# Patient Record
Sex: Female | Born: 1990 | Race: Black or African American | Hispanic: No | Marital: Single | Smoking: Former smoker
Health system: Southern US, Community
[De-identification: ages and names within clinical notes are randomized; demographics above are authoritative.]

---

## 2014-11-10 ENCOUNTER — Encounter (HOSPITAL_BASED_OUTPATIENT_CLINIC_OR_DEPARTMENT_OTHER): Payer: Self-pay

## 2014-11-10 ENCOUNTER — Emergency Department (HOSPITAL_BASED_OUTPATIENT_CLINIC_OR_DEPARTMENT_OTHER)
Admission: EM | Admit: 2014-11-10 | Discharge: 2014-11-10 | Disposition: A | Discharge status: Home / Self Care | Payer: Medicaid Other | Attending: Emergency Medicine | Admitting: Emergency Medicine

## 2014-11-10 DIAGNOSIS — L509 Urticaria, unspecified: Secondary | ICD-10-CM | POA: Insufficient documentation

## 2014-11-10 DIAGNOSIS — R21 Rash and other nonspecific skin eruption: Secondary | ICD-10-CM | POA: Diagnosis present

## 2014-11-10 MED ORDER — DEXAMETHASONE SODIUM PHOSPHATE 10 MG/ML IJ SOLN
10.0000 mg | Freq: Once | INTRAMUSCULAR | Status: AC
  Administered 2014-11-10: 10 mg via INTRAMUSCULAR
  Filled 2014-11-10: qty 1

## 2014-11-10 MED ORDER — HYDROXYZINE HCL 25 MG PO TABS: 25.0000 mg | ORAL_TABLET | Freq: Four times a day (QID) | ORAL | Status: DC | PRN

## 2014-11-10 NOTE — ED Notes (Signed)
Pt c/o hives since last night, she took benadryl and they went away, came back this evening.  Benadryl dose was 8 hours prior to arrival.  Pt denies SOB, no acute distress noted in triage.

## 2014-11-10 NOTE — ED Provider Notes (Signed)
CSN: [id number]     Arrival date & time 11/10/14  2310 History  By signing my name below, I, Lyndel Safe, attest that this documentation has been prepared under the direction and in the presence of Paula Libra, MD. Electronically Signed: Lyndel Safe, ED Scribe. 11/10/2014. 11:29 PM.   Chief Complaint  Patient presents with  . Allergic Reaction   HPI HPI Comments: Diane Austin is a 24 y.o. female who presents to the Emergency Department complaining of sudden onset urticarial rash that is diffuse and appeared without known cause yesterday evening. There was some transient associated throat swelling and difficulty breathing that resolved. She took one dose of Benadryl about 5 PM with transient partial relief. Denies diarrhea, wheezing, or current throat swelling or trouble breathing. Associated itching is moderate to severe and the urticarial lesions have been coming and going on various parts of her body. Lesions were more prominent on the trunk earlier but are now most prominent in the groins and lower legs.  History reviewed. No pertinent past medical history. Past Surgical History  Procedure Laterality Date  . Cesarean section  2015   No family history on file. Social History  Substance Use Topics  . Smoking status: None  . Smokeless tobacco: None  . Alcohol Use: None   OB History    No data available     Review of Systems A complete 10 system review of systems was obtained and is otherwise negative except at noted in the HPI and PMH.  Allergies  Review of patient's allergies indicates no known allergies.  Home Medications   Prior to Admission medications   Medication Sig Start Date End Date Taking? Authorizing Provider  hydrOXYzine (ATARAX/VISTARIL) 25 MG tablet Take 1-2 tablets (25-50 mg total) by mouth every 6 (six) hours as needed (for itching or hives; may cause drowsiness). 11/10/14   Cataleah Stites, MD   BP 106/57 mmHg  Pulse 113  Temp(Src) 98.3 F (36.8 C)  (Oral)  Resp 18  Ht  (1.702 m)  Wt 185 lb (83.915 kg)  BMI 28.97 kg/m2  SpO2 100%  LMP 11/07/2014 (Exact Date) Physical Exam General: Well-developed, well-nourished female in no acute distress; appearance consistent with age of record HENT: normocephalic; atraumatic; no pharyngeal edema; no dysphonia; no stridor Eyes: pupils equal, round and reactive to light; extraocular muscles intact Neck: supple Heart: regular rate and rhythm; no murmurs, rubs or gallops Lungs: clear to auscultation bilaterally Abdomen: soft; nondistended; nontender; no masses or hepatosplenomegaly; bowel sounds present Extremities: No deformity; full range of motion; pulses normal Neurologic: Awake, alert and oriented; motor function intact in all extremities and symmetric; no facial droop Skin: Warm and dry; urticarial rash, most prominent on the lower extremities Psychiatric: Normal mood and affect  ED Course  Procedures  DIAGNOSTIC STUDIES: Oxygen Saturation is 100% on RA, normal by my interpretation.    COORDINATION OF CARE: 11:29 PM Discussed treatment plan with pt at bedside and pt agreed to plan. Will order steroid injection and prescribe hydroxyzine.    MDM   Final diagnoses:  Urticaria   I personally performed the services described in this documentation, which was scribed in my presence. The recorded information has been reviewed and is accurate.    Paula Libra, MD 11/10/14 [phone]

## 2014-11-10 NOTE — Discharge Instructions (Signed)
Hives Hives are itchy, red, swollen areas of the skin. They can vary in size and location on your body. Hives can come and go for hours or several days (acute hives) or for several weeks (chronic hives). Hives do not spread from person to person (noncontagious). They may get worse with scratching, exercise, and emotional stress. CAUSES   Allergic reaction to food, additives, or drugs.  Infections, including the common cold.  Illness, such as vasculitis, lupus, or thyroid disease.  Exposure to sunlight, heat, or cold.  Exercise.  Stress.  Contact with chemicals. SYMPTOMS   Red or white swollen patches on the skin. The patches may change size, shape, and location quickly and repeatedly.  Itching.  Swelling of the hands, feet, and face. This may occur if hives develop deeper in the skin. DIAGNOSIS  Your caregiver can usually tell what is wrong by performing a physical exam. Skin or blood tests may also be done to determine the cause of your hives. In some cases, the cause cannot be determined. TREATMENT  Mild cases usually get better with medicines such as antihistamines. Severe cases may require an emergency epinephrine injection. If the cause of your hives is known, treatment includes avoiding that trigger.  HOME CARE INSTRUCTIONS   Avoid causes that trigger your hives.  Take antihistamines as directed by your caregiver to reduce the severity of your hives. Non-sedating or low-sedating antihistamines are usually recommended. Do not drive while taking an antihistamine.  Take any other medicines prescribed for itching as directed by your caregiver.  Wear loose-fitting clothing.  Keep all follow-up appointments as directed by your caregiver. SEEK MEDICAL CARE IF:   You have persistent or severe itching that is not relieved with medicine.  You have painful or swollen joints. SEEK IMMEDIATE MEDICAL CARE IF:   You have a fever.  Your tongue or lips are swollen.  You have  trouble breathing or swallowing.  You feel tightness in the throat or chest.  You have abdominal pain. These problems may be the first sign of a life-threatening allergic reaction. Call your local emergency services (911 in [place] MAKE SURE YOU:   Understand these instructions.  Will watch your condition.  Will get help right away if you are not doing well or get worse.   This information is not intended to replace advice given to you by your health care provider. Make sure you discuss any questions you have with your health care provider.   Document Released: 01/16/2005 Document Revised: 01/21/2013 Document Reviewed: 04/11/2011 Elsevier Interactive Patient Education 2016 [employer]  

## 2014-11-10 NOTE — ED Notes (Signed)
Pt verbalizes understanding of d/c instructions and denies any further needs at this time. 

## 2014-12-26 ENCOUNTER — Emergency Department (HOSPITAL_BASED_OUTPATIENT_CLINIC_OR_DEPARTMENT_OTHER)
Admission: EM | Admit: 2014-12-26 | Discharge: 2014-12-26 | Disposition: A | Discharge status: Home / Self Care | Payer: Medicaid Other | Attending: Emergency Medicine | Admitting: Emergency Medicine

## 2014-12-26 ENCOUNTER — Encounter (HOSPITAL_BASED_OUTPATIENT_CLINIC_OR_DEPARTMENT_OTHER): Payer: Self-pay | Admitting: Adult Health

## 2014-12-26 DIAGNOSIS — R509 Fever, unspecified: Secondary | ICD-10-CM | POA: Diagnosis not present

## 2014-12-26 DIAGNOSIS — R1111 Vomiting without nausea: Secondary | ICD-10-CM | POA: Insufficient documentation

## 2014-12-26 DIAGNOSIS — R197 Diarrhea, unspecified: Secondary | ICD-10-CM | POA: Insufficient documentation

## 2014-12-26 DIAGNOSIS — Z3202 Encounter for pregnancy test, result negative: Secondary | ICD-10-CM | POA: Insufficient documentation

## 2014-12-26 DIAGNOSIS — R1084 Generalized abdominal pain: Secondary | ICD-10-CM | POA: Insufficient documentation

## 2014-12-26 DIAGNOSIS — R1110 Vomiting, unspecified: Secondary | ICD-10-CM

## 2014-12-26 LAB — COMPREHENSIVE METABOLIC PANEL
ALBUMIN: 4.1 g/dL (ref 3.5–5.0)
ALK PHOS: 76 U/L (ref 38–126)
ALT: 19 U/L (ref 14–54)
ANION GAP: 7 (ref 5–15)
AST: 24 U/L (ref 15–41)
BUN: 11 mg/dL (ref 6–20)
CALCIUM: 9.1 mg/dL (ref 8.9–10.3)
CO2: 27 mmol/L (ref 22–32)
Chloride: 103 mmol/L (ref 101–111)
Creatinine, Ser: 0.75 mg/dL (ref 0.44–1.00)
GFR calc Af Amer: >60 mL/min (ref >60)
GFR calc non Af Amer: >60 mL/min (ref >60)
GLUCOSE: 76 mg/dL (ref 65–99)
Potassium: 3.2 mmol/L — ABNORMAL LOW (ref 3.5–5.1)
SODIUM: 137 mmol/L (ref 135–145)
Total Bilirubin: 0.9 mg/dL (ref 0.3–1.2)
Total Protein: 8 g/dL (ref 6.5–8.1)

## 2014-12-26 LAB — CBC
HCT: 42.4 % (ref 36.0–46.0)
Hemoglobin: 13.8 g/dL (ref 12.0–15.0)
MCH: 29.7 pg (ref 26.0–34.0)
MCHC: 32.5 g/dL (ref 30.0–36.0)
MCV: 91.4 fL (ref 78.0–100.0)
Platelets: 233 10*3/uL (ref 150–400)
RBC: 4.64 MIL/uL (ref 3.87–5.11)
RDW: 12.4 % (ref 11.5–15.5)
WBC: 8.1 10*3/uL (ref 4.0–10.5)

## 2014-12-26 LAB — URINALYSIS, ROUTINE W REFLEX MICROSCOPIC
BILIRUBIN URINE: NEGATIVE
Glucose, UA: NEGATIVE mg/dL
HGB URINE DIPSTICK: NEGATIVE
Ketones, ur: 15 mg/dL — AB
Nitrite: NEGATIVE
PH: 6.5 (ref 5.0–8.0)
Protein, ur: NEGATIVE mg/dL
SPECIFIC GRAVITY, URINE: 1.029 (ref 1.005–1.030)

## 2014-12-26 LAB — URINE MICROSCOPIC-ADD ON

## 2014-12-26 LAB — PREGNANCY, URINE: PREG TEST UR: NEGATIVE

## 2014-12-26 MED ORDER — ONDANSETRON 4 MG PO TBDP: ORAL_TABLET | ORAL | Status: DC

## 2014-12-26 MED ORDER — ONDANSETRON 4 MG PO TBDP
4.0000 mg | ORAL_TABLET | Freq: Once | ORAL | Status: AC | PRN
  Administered 2014-12-26: 4 mg via ORAL
  Filled 2014-12-26: qty 1

## 2014-12-26 NOTE — Discharge Instructions (Signed)

## 2014-12-26 NOTE — ED Provider Notes (Signed)
CSN: [id number]     Arrival date & time 12/26/14  2044 History  By signing my name below, I, Budd Palmer, attest that this documentation has been prepared under the direction and in the presence of Mirian Mo, MD. Electronically Signed: Budd Palmer, ED Scribe. 12/26/2014. 9:45 PM.     Chief Complaint  Patient presents with  . Emesis   Patient is a 24 y.o. female presenting with vomiting. The history is provided by the patient. No language interpreter was used.  Emesis Severity:  Moderate Duration:  16 hours Timing:  Intermittent Number of daily episodes:  10 Relieved by:  Nothing Associated symptoms: abdominal pain, diarrhea and fever   Associated symptoms: no sore throat   Abdominal pain:    Location:  Generalized Fever:    Duration:  16 hours   Timing:  Constant   Max temp PTA (F):  100.2 Risk factors: sick contacts    HPI Comments: Alacia Rehmann is a 24 y.o. female who presents to the Emergency Department complaining of emesis (10 episodes) onset a little over 16 hours ago. She reports associated n/d, fever (Tmax 100.2), and generalized abdominal pain. She notes recent sick contacts of the same. She denies any other medical issues. Pt denies cough, congestion, and sore throat.   History reviewed. No pertinent past medical history. Past Surgical History  Procedure Laterality Date  . Cesarean section  2015   History reviewed. No pertinent family history. Social History  Substance Use Topics  . Smoking status: None  . Smokeless tobacco: None  . Alcohol Use: None   OB History    No data available     Review of Systems  Constitutional: Positive for fever.  HENT: Negative for congestion and sore throat.   Respiratory: Negative for cough.   Gastrointestinal: Positive for nausea, vomiting, abdominal pain and diarrhea.  All other systems reviewed and are negative.   Allergies  Review of patient's allergies indicates no known allergies.  Home Medications    Prior to Admission medications   Medication Sig Start Date End Date Taking? Authorizing Provider  hydrOXYzine (ATARAX/VISTARIL) 25 MG tablet Take 1-2 tablets (25-50 mg total) by mouth every 6 (six) hours as needed (for itching or hives; may cause drowsiness). 11/10/14   John Molpus, MD  ondansetron (ZOFRAN ODT) 4 MG disintegrating tablet  ODT q4 hours prn nausea/vomit 12/26/14   Mirian Mo, MD   BP 91/64 mmHg  Pulse 82  Temp(Src) 97.6 F (36.4 C) (Oral)  Ht  (1.727 m)  Wt 185 lb (83.915 kg)  BMI 28.14 kg/m2  SpO2 100%  LMP 10/26/2014 (Approximate) Physical Exam  Constitutional: She is oriented to person, place, and time. She appears well-developed and well-nourished.  HENT:  Head: Normocephalic and atraumatic.  Right Ear: External ear normal.  Left Ear: External ear normal.  Eyes: Conjunctivae and EOM are normal. Pupils are equal, round, and reactive to light.  Neck: Normal range of motion. Neck supple.  Cardiovascular: Normal rate, regular rhythm, normal heart sounds and intact distal pulses.   Pulmonary/Chest: Effort normal and breath sounds normal.  Abdominal: Soft. Bowel sounds are normal. There is generalized tenderness (mild).  Musculoskeletal: Normal range of motion.  Neurological: She is alert and oriented to person, place, and time.  Skin: Skin is warm and dry.  Vitals reviewed.   ED Course  Procedures  DIAGNOSTIC STUDIES: Oxygen Saturation is 100% on RA, normal by my interpretation.    COORDINATION OF CARE: 9:43 PM - Discussed probable  viral infection. Discussed plans to order anti-nausea medication. Will give a note for workPt advised of plan for treatment and pt agrees.  Labs Review Labs Reviewed  COMPREHENSIVE METABOLIC PANEL - Abnormal; Notable for the following:    Potassium 3.2 (*)    All other components within normal limits  URINALYSIS, ROUTINE W REFLEX MICROSCOPIC (NOT [facility]) - Abnormal; Notable for the following:    Color, Urine AMBER  (*)    Ketones, ur 15 (*)    Leukocytes, UA TRACE (*)    All other components within normal limits  URINE MICROSCOPIC-ADD ON - Abnormal; Notable for the following:    Squamous Epithelial / LPF 0-5 (*)    Bacteria, UA FEW (*)    All other components within normal limits  CBC  PREGNANCY, URINE    Imaging Review No results found. I have personally reviewed and evaluated these images and lab results as part of my medical decision-making.   EKG Interpretation None      MDM   Final diagnoses:  Vomiting and diarrhea    24 y.o. female without pertinent PMH presents with n/v/d as above.  +sick contacts.  Well appearing, generally tender.  Given zofran and NS bolus, improved symptoms.  DC home in stable condition.    I have reviewed all laboratory and imaging studies if ordered as above  1. Vomiting and diarrhea           Mirian MoMatthew Calen Geister, MD 12/26/14 2228

## 2014-12-26 NOTE — ED Notes (Signed)
Presents with nausea, vomiting and diarrhe since 5 am. Endorses fever of 102 at home. Unable to hold down fluids or food. Endorses abdominal discomfort at the umbilicus.

## 2015-03-26 ENCOUNTER — Encounter (HOSPITAL_BASED_OUTPATIENT_CLINIC_OR_DEPARTMENT_OTHER): Payer: Self-pay | Admitting: *Deleted

## 2015-03-26 ENCOUNTER — Emergency Department (HOSPITAL_BASED_OUTPATIENT_CLINIC_OR_DEPARTMENT_OTHER)
Admission: EM | Admit: 2015-03-26 | Discharge: 2015-03-26 | Disposition: A | Discharge status: Home / Self Care | Payer: Medicaid Other | Attending: Emergency Medicine | Admitting: Emergency Medicine

## 2015-03-26 DIAGNOSIS — R197 Diarrhea, unspecified: Secondary | ICD-10-CM | POA: Insufficient documentation

## 2015-03-26 DIAGNOSIS — R112 Nausea with vomiting, unspecified: Secondary | ICD-10-CM | POA: Diagnosis not present

## 2015-03-26 LAB — URINALYSIS, ROUTINE W REFLEX MICROSCOPIC
BILIRUBIN URINE: NEGATIVE
GLUCOSE, UA: NEGATIVE mg/dL
Hgb urine dipstick: NEGATIVE
Ketones, ur: NEGATIVE mg/dL
Leukocytes, UA: NEGATIVE
NITRITE: NEGATIVE
PH: 6 (ref 5.0–8.0)
Protein, ur: NEGATIVE mg/dL
SPECIFIC GRAVITY, URINE: 1.042 — AB (ref 1.005–1.030)

## 2015-03-26 LAB — PREGNANCY, URINE: Preg Test, Ur: NEGATIVE

## 2015-03-26 NOTE — ED Notes (Signed)
N/V/D since Monday- episodes 2-3 times a day- sent from work to see when she can return

## 2015-03-26 NOTE — ED Notes (Signed)
Called x 1 no answer

## 2015-03-26 NOTE — ED Notes (Addendum)
Pt left after triage without informing staff

## 2015-03-26 NOTE — ED Notes (Signed)
3rd call no answer.

## 2015-03-26 NOTE — ED Notes (Signed)
Second call no answer

## 2015-03-27 ENCOUNTER — Emergency Department (HOSPITAL_BASED_OUTPATIENT_CLINIC_OR_DEPARTMENT_OTHER)
Admission: EM | Admit: 2015-03-27 | Discharge: 2015-03-28 | Disposition: A | Discharge status: Home / Self Care | Payer: Medicaid Other | Attending: Emergency Medicine | Admitting: Emergency Medicine

## 2015-03-27 ENCOUNTER — Encounter (HOSPITAL_BASED_OUTPATIENT_CLINIC_OR_DEPARTMENT_OTHER): Payer: Self-pay | Admitting: Emergency Medicine

## 2015-03-27 DIAGNOSIS — R109 Unspecified abdominal pain: Secondary | ICD-10-CM | POA: Diagnosis not present

## 2015-03-27 DIAGNOSIS — R1110 Vomiting, unspecified: Secondary | ICD-10-CM | POA: Insufficient documentation

## 2015-03-27 DIAGNOSIS — R197 Diarrhea, unspecified: Secondary | ICD-10-CM | POA: Diagnosis not present

## 2015-03-27 NOTE — ED Notes (Signed)
Abd pain with Diarrhea and vomiting x1 week.  Sts she "can't keep anything down".  Sts she was here yesterday but "you were so packed I thought I'd come back today."

## 2015-03-28 NOTE — ED Provider Notes (Signed)
Patient left after triage.   Richardean Canal, MD 03/28/15 [phone]

## 2015-03-28 NOTE — ED Notes (Signed)
Pt did not answer when called times 3 in the waiting room.

## 2015-07-20 ENCOUNTER — Emergency Department (HOSPITAL_BASED_OUTPATIENT_CLINIC_OR_DEPARTMENT_OTHER)
Admission: EM | Admit: 2015-07-20 | Discharge: 2015-07-20 | Disposition: A | Discharge status: Home / Self Care | Payer: Medicaid Other | Attending: Emergency Medicine | Admitting: Emergency Medicine

## 2015-07-20 ENCOUNTER — Encounter (HOSPITAL_BASED_OUTPATIENT_CLINIC_OR_DEPARTMENT_OTHER): Payer: Self-pay | Admitting: Emergency Medicine

## 2015-07-20 DIAGNOSIS — N390 Urinary tract infection, site not specified: Secondary | ICD-10-CM | POA: Diagnosis not present

## 2015-07-20 DIAGNOSIS — A5901 Trichomonal vulvovaginitis: Secondary | ICD-10-CM | POA: Diagnosis not present

## 2015-07-20 DIAGNOSIS — N898 Other specified noninflammatory disorders of vagina: Secondary | ICD-10-CM

## 2015-07-20 DIAGNOSIS — A599 Trichomoniasis, unspecified: Secondary | ICD-10-CM

## 2015-07-20 DIAGNOSIS — N899 Noninflammatory disorder of vagina, unspecified: Secondary | ICD-10-CM | POA: Diagnosis present

## 2015-07-20 DIAGNOSIS — R300 Dysuria: Secondary | ICD-10-CM

## 2015-07-20 LAB — URINALYSIS, ROUTINE W REFLEX MICROSCOPIC
Bilirubin Urine: NEGATIVE
Glucose, UA: NEGATIVE mg/dL
HGB URINE DIPSTICK: NEGATIVE
KETONES UR: NEGATIVE mg/dL
Nitrite: NEGATIVE
PROTEIN: NEGATIVE mg/dL
Specific Gravity, Urine: 1.024 (ref 1.005–1.030)
pH: 6.5 (ref 5.0–8.0)

## 2015-07-20 LAB — URINE MICROSCOPIC-ADD ON

## 2015-07-20 LAB — WET PREP, GENITAL
Sperm: NONE SEEN
Yeast Wet Prep HPF POC: NONE SEEN

## 2015-07-20 LAB — PREGNANCY, URINE: PREG TEST UR: NEGATIVE

## 2015-07-20 MED ORDER — CEFTRIAXONE SODIUM 250 MG IJ SOLR
250.0000 mg | Freq: Once | INTRAMUSCULAR | Status: AC
  Administered 2015-07-20: 250 mg via INTRAMUSCULAR
  Filled 2015-07-20: qty 250

## 2015-07-20 MED ORDER — METRONIDAZOLE 500 MG PO TABS
2000.0000 mg | ORAL_TABLET | Freq: Once | ORAL | Status: AC
  Administered 2015-07-20: 2000 mg via ORAL
  Filled 2015-07-20: qty 4

## 2015-07-20 MED ORDER — LIDOCAINE HCL (PF) 1 % IJ SOLN
INTRAMUSCULAR | Status: AC
  Administered 2015-07-20: 2.3 mL
  Filled 2015-07-20: qty 5

## 2015-07-20 MED ORDER — CEPHALEXIN 500 MG PO CAPS: 500.0000 mg | ORAL_CAPSULE | Freq: Four times a day (QID) | ORAL | Status: DC

## 2015-07-20 MED ORDER — AZITHROMYCIN 250 MG PO TABS
1000.0000 mg | ORAL_TABLET | Freq: Once | ORAL | Status: AC
Start: 2015-07-20 — End: 2015-07-20
  Administered 2015-07-20: 1000 mg via ORAL
  Filled 2015-07-20: qty 4

## 2015-07-20 MED FILL — CEPHALEXIN 500 MG CAPSULE: 500 | 7 days supply | Qty: 28 | Fill #0

## 2015-07-20 NOTE — Discharge Instructions (Signed)
Medications: Keflex  Treatment: Take Keflex as prescribed perscribed for your urinary tract infection. You be called in 2-3 days if any of your results return positive, including gonorrhea, chlamydia, HIV, syphilis. You have been treated for trichomonas, gonorrhea, chlamydia in the emergency department today. If your results return positive for HIV or syphilis, you will need to seek treatment at the health department. Please make all of your sexual partners aware of your trichomonas infection and if any of your results return positive. They will need to be treated as well. Drink plenty of water, at least 8 glasses of water daily.  Follow-up: Please return to the emergency department if you develop any new or worsening symptoms.   Trichomoniasis Trichomoniasis is an infection caused by an organism called Trichomonas. The infection can affect both women and men. In women, the outer female genitalia and the vagina are affected. In men, the penis is mainly affected, but the prostate and other reproductive organs can also be involved. Trichomoniasis is a sexually transmitted infection (STI) and is most often passed to another person through sexual contact.  RISK FACTORS  Having unprotected sexual intercourse.  Having sexual intercourse with an infected partner. SIGNS AND SYMPTOMS  Symptoms of trichomoniasis in women include:  Abnormal gray-green frothy vaginal discharge.  Itching and irritation of the vagina.  Itching and irritation of the area outside the vagina. Symptoms of trichomoniasis in men include:   Penile discharge with or without pain.  Pain during urination. This results from inflammation of the urethra. DIAGNOSIS  Trichomoniasis may be found during a Pap test or physical exam. Your health care provider may use one of the following methods to help diagnose this infection:  Testing the pH of the vagina with a test tape.  Using a vaginal swab test that checks for the Trichomonas  organism. A test is available that provides results within a few minutes.  Examining a urine sample.  Testing vaginal secretions. Your health care provider may test you for other STIs, including HIV. TREATMENT   You may be given medicine to fight the infection. Women should inform their health care provider if they could be or are pregnant. Some medicines used to treat the infection should not be taken during pregnancy.  Your health care provider may recommend over-the-counter medicines or creams to decrease itching or irritation.  Your sexual partner will need to be treated if infected.  Your health care provider may test you for infection again 3 months after treatment. HOME CARE INSTRUCTIONS   Take medicines only as directed by your health care provider.  Take over-the-counter medicine for itching or irritation as directed by your health care provider.  Do not have sexual intercourse while you have the infection.  Women should not douche or wear tampons while they have the infection.  Discuss your infection with your partner. Your partner may have gotten the infection from you, or you may have gotten it from your partner.  Have your sex partner get examined and treated if necessary.  Practice safe, informed, and protected sex.  See your health care provider for other STI testing. SEEK MEDICAL CARE IF:   You still have symptoms after you finish your medicine.  You develop abdominal pain.  You have pain when you urinate.  You have bleeding after sexual intercourse.  You develop a rash.  Your medicine makes you sick or makes you throw up (vomit). MAKE SURE YOU:  Understand these instructions.  Will watch your condition.  Will get  help right away if you are not doing well or get worse.   This information is not intended to replace advice given to you by your health care provider. Make sure you discuss any questions you have with your health care provider.     Document Released: 07/12/2000 Document Revised: 02/06/2014 Document Reviewed: 10/28/2012 Elsevier Interactive Patient Education 2016 [employer]  Sexually Transmitted Disease A sexually transmitted disease (STD) is a disease or infection that may be passed (transmitted) from person to person, usually during sexual activity. This may happen by way of saliva, semen, blood, vaginal mucus, or urine. Common STDs include:  Gonorrhea.  Chlamydia.  Syphilis.  HIV and AIDS.  Genital herpes.  Hepatitis B and C.  Trichomonas.  Human papillomavirus (HPV).  Pubic lice.  Scabies.  Mites.  Bacterial vaginosis. WHAT ARE CAUSES OF STDs? An STD may be caused by bacteria, a virus, or parasites. STDs are often transmitted during sexual activity if one person is infected. However, they may also be transmitted through nonsexual means. STDs may be transmitted after:   Sexual intercourse with an infected person.  Sharing sex toys with an infected person.  Sharing needles with an infected person or using unclean piercing or tattoo needles.  Having intimate contact with the genitals, mouth, or rectal areas of an infected person.  Exposure to infected fluids during birth. WHAT ARE THE SIGNS AND SYMPTOMS OF STDs? Different STDs have different symptoms. Some people may not have any symptoms. If symptoms are present, they may include:  Painful or bloody urination.  Pain in the pelvis, abdomen, vagina, anus, throat, or eyes.  A skin rash, itching, or irritation.  Growths, ulcerations, blisters, or sores in the genital and anal areas.  Abnormal vaginal discharge with or without bad odor.  Penile discharge in men.  Fever.  Pain or bleeding during sexual intercourse.  Swollen glands in the groin area.  Yellow skin and eyes (jaundice). This is seen with hepatitis.  Swollen testicles.  Infertility.  Sores and blisters in the mouth. HOW ARE STDs DIAGNOSED? To make a diagnosis,  your health care provider may:  Take a medical history.  Perform a physical exam.  Take a sample of any discharge to examine.  Swab the throat, cervix, opening to the penis, rectum, or vagina for testing.  Test a sample of your first morning urine.  Perform blood tests.  Perform a Pap test, if this applies.  Perform a colposcopy.  Perform a laparoscopy. HOW ARE STDs TREATED? Treatment depends on the STD. Some STDs may be treated but not cured.  Chlamydia, gonorrhea, trichomonas, and syphilis can be cured with antibiotic medicine.  Genital herpes, hepatitis, and HIV can be treated, but not cured, with prescribed medicines. The medicines lessen symptoms.  Genital warts from HPV can be treated with medicine or by freezing, burning (electrocautery), or surgery. Warts may come back.  HPV cannot be cured with medicine or surgery. However, abnormal areas may be removed from the cervix, vagina, or vulva.  If your diagnosis is confirmed, your recent sexual partners need treatment. This is true even if they are symptom-free or have a negative culture or evaluation. They should not have sex until their health care providers say it is okay.  Your health care provider may test you for infection again 3 months after treatment. HOW CAN I REDUCE MY RISK OF GETTING AN STD? Take these steps to reduce your risk of getting an STD:  Use latex condoms, dental dams, and water-soluble  lubricants during sexual activity. Do not use petroleum jelly or oils.  Avoid having multiple sex partners.  Do not have sex with someone who has other sex partners  Do not have sex with anyone you do not know or who is at high risk for an STD.  Avoid risky sex practices that can break your skin.  Do not have sex if you have open sores on your mouth or skin.  Avoid drinking too much alcohol or taking illegal drugs. Alcohol and drugs can affect your judgment and put you in a vulnerable position.  Avoid engaging  in oral and anal sex acts.  Get vaccinated for HPV and hepatitis. If you have not received these vaccines in the past, talk to your health care provider about whether one or both might be right for you.  If you are at risk of being infected with HIV, it is recommended that you take a prescription medicine daily to prevent HIV infection. This is called pre-exposure prophylaxis (PrEP). You are considered at risk if:  You are a man who has sex with other men (MSM).  You are a heterosexual man or woman and are sexually active with more than one partner.  You take drugs by injection.  You are sexually active with a partner who has HIV.  Talk with your health care provider about whether you are at high risk of being infected with HIV. If you choose to begin PrEP, you should first be tested for HIV. You should then be tested every 3 months for as long as you are taking PrEP. WHAT SHOULD I DO IF I THINK I HAVE AN STD?  See your health care provider.  Tell your sexual partner(s). They should be tested and treated for any STDs.  Do not have sex until your health care provider says it is okay. WHEN SHOULD I GET IMMEDIATE MEDICAL CARE? Contact your health care provider right away if:   You have severe abdominal pain.  You are a man and notice swelling or pain in your testicles.  You are a woman and notice swelling or pain in your vagina.   This information is not intended to replace advice given to you by your health care provider. Make sure you discuss any questions you have with your health care provider.   Document Released: 04/08/2002 Document Revised: 02/06/2014 Document Reviewed: 08/06/2012 Elsevier Interactive Patient Education 2016 Elsevier Inc.  Urinary Tract Infection Urinary tract infections (UTIs) can develop anywhere along your urinary tract. Your urinary tract is your body's drainage system for removing wastes and extra water. Your urinary tract includes two kidneys, two  ureters, a bladder, and a urethra. Your kidneys are a pair of bean-shaped organs. Each kidney is about the size of your fist. They are located below your ribs, one on each side of your spine. CAUSES Infections are caused by microbes, which are microscopic organisms, including fungi, viruses, and bacteria. These organisms are so small that they can only be seen through a microscope. Bacteria are the microbes that most commonly cause UTIs. SYMPTOMS  Symptoms of UTIs may vary by age and gender of the patient and by the location of the infection. Symptoms in young women typically include a frequent and intense urge to urinate and a painful, burning feeling in the bladder or urethra during urination. Older women and men are more likely to be tired, shaky, and weak and have muscle aches and abdominal pain. A fever may mean the infection is in your  kidneys. Other symptoms of a kidney infection include pain in your back or sides below the ribs, nausea, and vomiting. DIAGNOSIS To diagnose a UTI, your caregiver will ask you about your symptoms. Your caregiver will also ask you to provide a urine sample. The urine sample will be tested for bacteria and white blood cells. White blood cells are made by your body to help fight infection. TREATMENT  Typically, UTIs can be treated with medication. Because most UTIs are caused by a bacterial infection, they usually can be treated with the use of antibiotics. The choice of antibiotic and length of treatment depend on your symptoms and the type of bacteria causing your infection. HOME CARE INSTRUCTIONS  If you were prescribed antibiotics, take them exactly as your caregiver instructs you. Finish the medication even if you feel better after you have only taken some of the medication.  Drink enough water and fluids to keep your urine clear or pale yellow.  Avoid caffeine, tea, and carbonated beverages. They tend to irritate your bladder.  Empty your bladder often. Avoid  holding urine for long periods of time.  Empty your bladder before and after sexual intercourse.  After a bowel movement, women should cleanse from front to back. Use each tissue only once. SEEK MEDICAL CARE IF:   You have back pain.  You develop a fever.  Your symptoms do not begin to resolve within 3 days. SEEK IMMEDIATE MEDICAL CARE IF:   You have severe back pain or lower abdominal pain.  You develop chills.  You have nausea or vomiting.  You have continued burning or discomfort with urination. MAKE SURE YOU:   Understand these instructions.  Will watch your condition.  Will get help right away if you are not doing well or get worse.   This information is not intended to replace advice given to you by your health care provider. Make sure you discuss any questions you have with your health care provider.   Document Released: 10/26/2004 Document Revised: 10/07/2014 Document Reviewed: 02/24/2011 Elsevier Interactive Patient Education [employer]

## 2015-07-20 NOTE — ED Notes (Signed)
PA to bedside

## 2015-07-20 NOTE — ED Notes (Signed)
Vaginal itching x 3 dats

## 2015-07-20 NOTE — ED Provider Notes (Signed)
CSN: [id number]     Arrival date & time 07/20/15  1247 History   First MD Initiated Contact with Patient 07/20/15 1453     Chief Complaint  Patient presents with  . Vaginal Itching     (Consider location/radiation/quality/duration/timing/severity/associated sxs/prior Treatment) HPI Comments: Patient is a previously healthy 25 year old female who presents with a 3 day history of vaginal itching and burning on urination. Patient reports that she has also had associated pressure with urinating and urinary frequency. Patient had one episode of nausea on Sunday, which resolved. Patient denies any vaginal discharge, but she has felt increased moisture your vaginal area. Patient denies any new lesions to her vaginal area. Patient does have a new sexual partner and she has a possible exposure to sexually transmitted disease. Patient's LMP was May 7. Patient normally has irregular periods due to her to her Mirena. Patient has had her Mirena inserted for the past 3 years without issue. However, patient states she has had some pelvic cramping following sexual intercourse after changing sexual partners, which she was made aware by her OB/GYN that this could be an issue at insertion of the IUD. Patient has not seen her OB/GYN since its insertion. Patient has not taken any medications for this at home. Patient denies any chest pain, shortness of breath, abdominal pain, vomiting, fevers.  Patient is a 25 y.o. female presenting with vaginal itching. The history is provided by the patient.  Vaginal Itching Associated symptoms include nausea (1 episode 2 days ago). Pertinent negatives include no abdominal pain, chest pain, chills, fever, headaches, rash, sore throat or vomiting.    History reviewed. No pertinent past medical history. Past Surgical History  Procedure Laterality Date  . Cesarean section  2015   History reviewed. No pertinent family history. Social History  Substance Use Topics  . Smoking  status: Never Smoker   . Smokeless tobacco: Never Used  . Alcohol Use: No   OB History    No data available     Review of Systems  Constitutional: Negative for fever and chills.  HENT: Negative for facial swelling and sore throat.   Respiratory: Negative for shortness of breath.   Cardiovascular: Negative for chest pain.  Gastrointestinal: Positive for nausea (1 episode 2 days ago). Negative for vomiting and abdominal pain.  Genitourinary: Positive for dysuria and frequency.  Musculoskeletal: Negative for back pain.  Skin: Negative for rash and wound.  Neurological: Negative for headaches.  Psychiatric/Behavioral: The patient is not nervous/anxious.       Allergies  Review of patient's allergies indicates no known allergies.  Home Medications   Prior to Admission medications   Medication Sig Start Date End Date Taking? Authorizing Provider  cephALEXin (KEFLEX) 500 MG capsule Take 1 capsule (500 mg total) by mouth 4 (four) times daily. 07/20/15   Emi Holes, PA-C  hydrOXYzine (ATARAX/VISTARIL) 25 MG tablet Take 1-2 tablets (25-50 mg total) by mouth every 6 (six) hours as needed (for itching or hives; may cause drowsiness). 11/10/14   John Molpus, MD  ondansetron (ZOFRAN ODT) 4 MG disintegrating tablet  ODT q4 hours prn nausea/vomit 12/26/14   Mirian Mo, MD   BP 116/56 mmHg  Pulse 90  Temp(Src) 98.7 F (37.1 C) (Oral)  Resp 18  Ht  (1.727 m)  Wt 79.379 kg  BMI 26.61 kg/m2  SpO2 100% Physical Exam  Constitutional: She appears well-developed and well-nourished. No distress.  HENT:  Head: Normocephalic and atraumatic.  Mouth/Throat: Oropharynx is clear and moist.  No oropharyngeal exudate.  Eyes: Conjunctivae are normal. Pupils are equal, round, and reactive to light. Right eye exhibits no discharge. Left eye exhibits no discharge. No scleral icterus.  Neck: Normal range of motion. Neck supple. No thyromegaly present.  Cardiovascular: Normal rate, regular  rhythm, normal heart sounds and intact distal pulses.  Exam reveals no gallop and no friction rub.   No murmur heard. Pulmonary/Chest: Effort normal and breath sounds normal. No stridor. No respiratory distress. She has no wheezes. She has no rales.  Abdominal: Soft. Bowel sounds are normal. She exhibits no distension. There is no tenderness. There is no rebound and no guarding.  Genitourinary: There is no rash, tenderness or lesion on the right labia. There is no rash, tenderness or lesion on the left labia. Uterus is not enlarged and not tender. Cervix exhibits discharge (White, milky; IUD string visualized at os). Cervix exhibits no motion tenderness. Right adnexum displays no mass, no tenderness and no fullness. Left adnexum displays no mass, no tenderness and no fullness. No tenderness or bleeding in the vagina.  Musculoskeletal: She exhibits no edema.  Lymphadenopathy:    She has no cervical adenopathy.  Neurological: She is alert. Coordination normal.  Skin: Skin is warm and dry. No rash noted. She is not diaphoretic. No pallor.  Psychiatric: She has a normal mood and affect.  Nursing note and vitals reviewed.   ED Course  Procedures (including critical care time) Labs Review Labs Reviewed  WET PREP, GENITAL - Abnormal; Notable for the following:    Trich, Wet Prep PRESENT (*)    Clue Cells Wet Prep HPF POC PRESENT (*)    WBC, Wet Prep HPF POC MANY (*)    All other components within normal limits  URINALYSIS, ROUTINE W REFLEX MICROSCOPIC (NOT AT [facility]) - Abnormal; Notable for the following:    APPearance CLOUDY (*)    Leukocytes, UA MODERATE (*)    All other components within normal limits  URINE MICROSCOPIC-ADD ON - Abnormal; Notable for the following:    Squamous Epithelial / LPF 6-30 (*)    Bacteria, UA MANY (*)    All other components within normal limits  URINE CULTURE  PREGNANCY, URINE  RPR  HIV ANTIBODY (ROUTINE TESTING)  GC/CHLAMYDIA PROBE AMP ([place]) NOT AT  [facility]    Imaging Review No results found. I have personally reviewed and evaluated these images and lab results as part of my medical decision-making.   EKG Interpretation None      MDM   Patient presenting with vaginal irritation and dysuria. UA shows moderate leukocytes and many bacteria. Urine culture sent. Wet prep shows Trichomonas, clue cells, many WBCs. Urine pregnancy negative. Gonorrhea, chlamydia, HIV, RPR sent. Patient treated for Trichomonas with Flagyl 2g in ED. Patient also opted to be treated for gonorrhea and chlamydia as well; ceftriaxone and azithromycin given in ED. Patient advised that she will be called in 2-3 days if any of her results return positive, and that she should to seek treatment for HIV and/or syphilis at the health department. She was also advised to make any of her sexual partners aware of any positive results in their need for treatment. I will discharge patient home with Keflex to treat her urinary tract infection. Patient encouraged to follow up with her OB/GYN for follow-up and annual exam, as well as addressing her concerns regarding her intermittent pain from her IUD. Patient understands and is in agreement with plan. Patient vitals stable throughout ED course and discharged  in satisfactory condition.   Final diagnoses:  Vaginal itching  Dysuria  UTI (lower urinary tract infection)  Trichomonas vaginalis infection      Emi Holeslexandra M Epic Tribbett, PA-C 07/20/15 1645  Lavera Guiseana Duo Liu, MD 07/20/15 [phone]

## 2015-07-20 NOTE — ED Notes (Signed)
Supplies gathered and placed at bedside for pelvic exam.

## 2015-07-20 NOTE — ED Notes (Signed)
Pt reports vaginal itching x 3 days. Pt reports new sexual partner. Pt reports pelvic pain after sex. Pt reports pressure when she urinates. Pt reports nausea no vomiting. Denies vaginal discharge or bleeding. Pt c/o irritated feeling when she urinates and reports frequency.

## 2015-07-21 LAB — GC/CHLAMYDIA PROBE AMP (~~LOC~~) NOT AT ARMC
Chlamydia: NEGATIVE
Neisseria Gonorrhea: NEGATIVE

## 2015-07-21 LAB — RPR: RPR Ser Ql: NONREACTIVE

## 2015-07-21 LAB — URINE CULTURE: Special Requests: NORMAL

## 2015-07-21 LAB — HIV ANTIBODY (ROUTINE TESTING W REFLEX): HIV Screen 4th Generation wRfx: NONREACTIVE

## 2015-09-20 ENCOUNTER — Encounter (HOSPITAL_BASED_OUTPATIENT_CLINIC_OR_DEPARTMENT_OTHER): Payer: Self-pay

## 2015-09-20 ENCOUNTER — Emergency Department (HOSPITAL_BASED_OUTPATIENT_CLINIC_OR_DEPARTMENT_OTHER)
Admission: EM | Admit: 2015-09-20 | Discharge: 2015-09-20 | Disposition: A | Discharge status: Home / Self Care | Payer: Medicaid Other | Attending: Emergency Medicine | Admitting: Emergency Medicine

## 2015-09-20 DIAGNOSIS — N898 Other specified noninflammatory disorders of vagina: Secondary | ICD-10-CM | POA: Diagnosis present

## 2015-09-20 DIAGNOSIS — B9689 Other specified bacterial agents as the cause of diseases classified elsewhere: Secondary | ICD-10-CM

## 2015-09-20 DIAGNOSIS — N760 Acute vaginitis: Secondary | ICD-10-CM | POA: Insufficient documentation

## 2015-09-20 LAB — WET PREP, GENITAL
Sperm: NONE SEEN
TRICH WET PREP: NONE SEEN
Yeast Wet Prep HPF POC: NONE SEEN

## 2015-09-20 LAB — URINALYSIS, ROUTINE W REFLEX MICROSCOPIC
Bilirubin Urine: NEGATIVE
GLUCOSE, UA: NEGATIVE mg/dL
HGB URINE DIPSTICK: NEGATIVE
Ketones, ur: NEGATIVE mg/dL
LEUKOCYTES UA: NEGATIVE
Nitrite: NEGATIVE
PROTEIN: NEGATIVE mg/dL
SPECIFIC GRAVITY, URINE: 1.026 (ref 1.005–1.030)
pH: 6 (ref 5.0–8.0)

## 2015-09-20 LAB — PREGNANCY, URINE: Preg Test, Ur: NEGATIVE

## 2015-09-20 MED ORDER — METRONIDAZOLE 500 MG PO TABS: 500.0000 mg | ORAL_TABLET | Freq: Two times a day (BID) | ORAL | 0 refills | Status: DC

## 2015-09-20 MED ORDER — FLUCONAZOLE 150 MG PO TABS: 150.0000 mg | ORAL_TABLET | Freq: Once | ORAL | 0 refills | Status: AC

## 2015-09-20 MED FILL — metroNIDAZOLE 500 MG TABS: 500 | 7 days supply | Qty: 14 | Fill #0

## 2015-09-20 MED FILL — FLUCONAZOLE 150 MG TABLET: 150 | 1 days supply | Qty: 1 | Fill #0

## 2015-09-20 NOTE — ED Notes (Signed)
Pelvic cart is set up in pt's room

## 2015-09-20 NOTE — ED Notes (Signed)
[facility]. Reported she had a bacterial infection (bacterial vaginosis). Received a medication and after she finished the regimen and went to her doctor reported that she still had the infection, reported they preformed a Pap and gave her another medication.

## 2015-09-20 NOTE — Discharge Instructions (Signed)
Take Flagyl as prescribed until all gone. Take the Diflucan after finish Flagyl. Please follow-up with your doctor for recheck. Your gonorrhea and Chlamydia cultures are pending and he will be called if abnormal

## 2015-09-20 NOTE — ED Notes (Signed)
Pt reports being moist and no odor yet but is coming and having pain in lower stomach. Reported noticing the pain and irritation about three days ago. Denies painful urination. Denies any fevers. Denies N/V/D.

## 2015-09-20 NOTE — ED Triage Notes (Signed)
C/o lower abd pain, vaginal d/c x 3 days-NAD-steady gait

## 2015-09-20 NOTE — ED Provider Notes (Signed)
[facility] Provider Note   CSN: [id number] Arrival date & time: 09/20/15  1246     History   Chief Complaint Chief Complaint  Patient presents with  . Abdominal Pain    HPI Diane Austin is a 25 y.o. female.  HPI Diane Austin is a 25 y.o. female presents to ED with complaint of vaginal itching and discharge. Pt states symptoms began about 3-4 days ago. Reports white discharge. Reports some vaginal discomfort during intercourse. Denies urinary symptoms. Denies any abdominal pain or back pain. States that she has recently had trichomonas which was treated at the end of June, and since then has been diagnosed with bacterial vaginosis and treated for that. States that her partner was treated as well. She reports one sexual partner. She states nothing is making her symptoms better or worse. Denies treatment at home prior to coming in.  History reviewed. No pertinent past medical history.  There are no active problems to display for this patient.   Past Surgical History:  Procedure Laterality Date  . CESAREAN SECTION  2015    OB History    No data available       Home Medications    Prior to Admission medications   Not on File    Family History No family history on file.  Social History Social History  Substance Use Topics  . Smoking status: Never Smoker  . Smokeless tobacco: Never Used  . Alcohol use No     Allergies   Review of patient's allergies indicates no known allergies.   Review of Systems Review of Systems  Constitutional: Negative for chills and fever.  Respiratory: Negative for cough, chest tightness and shortness of breath.   Cardiovascular: Negative for chest pain, palpitations and leg swelling.  Gastrointestinal: Negative for abdominal pain, diarrhea, nausea and vomiting.  Genitourinary: Positive for pelvic pain and vaginal discharge. Negative for dysuria, flank pain, vaginal bleeding and vaginal pain.  Musculoskeletal: Negative  for arthralgias, myalgias, neck pain and neck stiffness.  Skin: Negative for rash.  Neurological: Negative for dizziness, weakness and headaches.  All other systems reviewed and are negative.    Physical Exam Updated Vital Signs BP 95/59 (BP Location: Left Arm)   Pulse 68   Temp 98.2 F (36.8 C) (Oral)   Resp 18   Ht 5\' 8"  (1.727 m)   Wt 77.1 kg   LMP 09/02/2015   SpO2 100%   BMI 25.85 kg/m   Physical Exam  Constitutional: She appears well-developed and well-nourished. No distress.  HENT:  Head: Normocephalic.  Eyes: Conjunctivae are normal.  Neck: Neck supple.  Cardiovascular: Normal rate, regular rhythm and normal heart sounds.   Pulmonary/Chest: Effort normal and breath sounds normal. No respiratory distress. She has no wheezes. She has no rales.  Abdominal: Soft. Bowel sounds are normal. She exhibits no distension. There is no tenderness. There is no rebound.  Genitourinary:  Genitourinary Comments: Normal external genitalia. Normal vaginal canal. Small thin white discharge. Cervix is normal, closed. No CMT. No uterine or adnexal tenderness. No masses palpated.    Musculoskeletal: She exhibits no edema.  Neurological: She is alert.  Skin: Skin is warm and dry.  Psychiatric: She has a normal mood and affect. Her behavior is normal.  Nursing note and vitals reviewed.    ED Treatments / Results  Labs (all labs ordered are listed, but only abnormal results are displayed) Labs Reviewed  WET PREP, GENITAL - Abnormal; Notable for the following:  Result Value   Clue Cells Wet Prep HPF POC PRESENT (*)    WBC, Wet Prep HPF POC FEW (*)    All other components within normal limits  URINALYSIS, ROUTINE W REFLEX MICROSCOPIC (NOT AT [facility]) - Abnormal; Notable for the following:    APPearance CLOUDY (*)    All other components within normal limits  PREGNANCY, URINE  GC/CHLAMYDIA PROBE AMP ([place]) NOT AT [facility]    EKG  EKG Interpretation None        Radiology No results found.  Procedures Procedures (including critical care time)  Medications Ordered in ED Medications - No data to display   Initial Impression / Assessment and Plan / ED Course  I have reviewed the triage vital signs and the nursing notes.  Pertinent labs & imaging results that were available during my care of the patient were reviewed by me and considered in my medical decision making (see chart for details).  Clinical Course   Patient with recurrent vaginal discharge or vaginal discomfort. Exam is unremarkable. Wet prep showing clue cells, will treat for bacterial vaginosis. Urinalysis negative. She is not pregnant. Plan to discharge home with close outpatient follow-up.  Vitals:   09/20/15 1256 09/20/15 1520  BP: 98/60 95/59  Pulse: 89 68  Resp: 16 18  Temp: 98.1 F (36.7 C) 98.2 F (36.8 C)  TempSrc: Oral Oral  SpO2: 100% 100%  Weight: 77.1 kg   Height: 5\' 8"  (1.727 m)      Final Clinical Impressions(s) / ED Diagnoses   Final diagnoses:  Bacterial vaginosis    New Prescriptions New Prescriptions   FLUCONAZOLE (DIFLUCAN) 150 MG TABLET    Take 1 tablet (150 mg total) by mouth once.   METRONIDAZOLE (FLAGYL) 500 MG TABLET    Take 1 tablet (500 mg total) by mouth 2 (two) times daily.     Jaynie Crumbleatyana Braxten Memmer, PA-C 09/20/15 1539    Doug SouSam Jacubowitz, MD 09/20/15 [phone]

## 2015-09-21 LAB — GC/CHLAMYDIA PROBE AMP (~~LOC~~) NOT AT ARMC
Chlamydia: NEGATIVE
NEISSERIA GONORRHEA: NEGATIVE

## 2016-03-19 ENCOUNTER — Emergency Department (HOSPITAL_BASED_OUTPATIENT_CLINIC_OR_DEPARTMENT_OTHER)
Admission: EM | Admit: 2016-03-19 | Discharge: 2016-03-19 | Disposition: A | Discharge status: Home / Self Care | Payer: Medicaid Other | Attending: Emergency Medicine | Admitting: Emergency Medicine

## 2016-03-19 ENCOUNTER — Encounter (HOSPITAL_BASED_OUTPATIENT_CLINIC_OR_DEPARTMENT_OTHER): Payer: Self-pay | Admitting: *Deleted

## 2016-03-19 DIAGNOSIS — H00011 Hordeolum externum right upper eyelid: Secondary | ICD-10-CM | POA: Insufficient documentation

## 2016-03-19 DIAGNOSIS — H5711 Ocular pain, right eye: Secondary | ICD-10-CM | POA: Diagnosis present

## 2016-03-19 MED ORDER — ERYTHROMYCIN 5 MG/GM OP OINT
TOPICAL_OINTMENT | Freq: Every day | OPHTHALMIC | Status: DC
  Administered 2016-03-19: 13:00:00 via OPHTHALMIC
  Filled 2016-03-19: qty 3.5

## 2016-03-19 NOTE — ED Provider Notes (Signed)
[facility]   CSN: [id number] Arrival date & time: 03/19/16  1127     History   Chief Complaint Chief Complaint  Patient presents with  . Facial Swelling    right eyelid    HPI Diane Austin is a 26 y.o. female.  Patient is a healthy 26 year old female presenting today with 5 days of worsening right upper eyelid pain and swelling. She has tried compresses with no improvement. She denies any visual changes, eye pain, drainage or redness. It's localized to the right outer upper eyelid. She denies any recent new cosmetic products or fake lashes. No pus drainage noted.   The history is provided by the patient.    History reviewed. No pertinent past medical history.  There are no active problems to display for this patient.   Past Surgical History:  Procedure Laterality Date  . CESAREAN SECTION  2015    OB History    No data available       Home Medications    Prior to Admission medications   Medication Sig Start Date End Date Taking? Authorizing Provider  metroNIDAZOLE (FLAGYL) 500 MG tablet Take 1 tablet (500 mg total) by mouth 2 (two) times daily. 09/20/15   Jaynie Crumbleatyana Kirichenko, PA-C    Family History No family history on file.  Social History Social History  Substance Use Topics  . Smoking status: Never Smoker  . Smokeless tobacco: Never Used  . Alcohol use No     Allergies   Patient has no known allergies.   Review of Systems Review of Systems  All other systems reviewed and are negative.    Physical Exam Updated Vital Signs BP 107/65 (BP Location: Right Arm)   Pulse 69   Temp 98.4 F (36.9 C) (Oral)   Resp 20   Ht 5\' 8"  (1.727 m)   Wt 186 lb (84.4 kg)   LMP 03/05/2016 (Exact Date)   SpO2 100%   BMI 28.28 kg/m   Physical Exam  Constitutional: She appears well-developed and well-nourished. No distress.  HENT:  Head: Normocephalic and atraumatic.  Eyes: Pupils are equal, round, and reactive to light. Left  eye exhibits no discharge and no exudate. Right conjunctiva is not injected. Right conjunctiva has no hemorrhage. Left conjunctiva is not injected. Left conjunctiva has no hemorrhage. Right eye exhibits normal extraocular motion.  Right upper outer lid with swelling and mild induration. Normal extraocular movements.  With lid eversion normal without signs of abscess.  Cardiovascular: Normal rate.   Pulmonary/Chest: Effort normal.  Skin: Skin is warm and dry.  Psychiatric: She has a normal mood and affect. Her behavior is normal.  Nursing note and vitals reviewed.    ED Treatments / Results  Labs (all labs ordered are listed, but only abnormal results are displayed) Labs Reviewed - No data to display  EKG  EKG Interpretation None       Radiology No results found.  Procedures Procedures (including critical care time)  Medications Ordered in ED Medications  erythromycin ophthalmic ointment (not administered)     Initial Impression / Assessment and Plan / ED Course  I have reviewed the triage vital signs and the nursing notes.  Pertinent labs & imaging results that were available during my care of the patient were reviewed by me and considered in my medical decision making (see chart for details).    Patient with uncomplicated stye to the right upper eyelid. No evidence of orbital edema or periorbital cellulitis. No eye  drainage or symptoms of conjunctivitis. Patient encouraged to continue compresses and follow-up if symptoms are not improving.  Final Clinical Impressions(s) / ED Diagnoses   Final diagnoses:  Hordeolum externum of right upper eyelid    New Prescriptions New Prescriptions   No medications on file     Gwyneth Sprout, MD 03/19/16 1311

## 2016-03-19 NOTE — ED Triage Notes (Signed)
Patient states that she developed pain in the right eye pain and itching.  Woke up this morning with swelling on the eye lid.  No known injury.

## 2016-04-11 ENCOUNTER — Emergency Department (HOSPITAL_BASED_OUTPATIENT_CLINIC_OR_DEPARTMENT_OTHER): Payer: Medicaid Other

## 2016-04-11 ENCOUNTER — Encounter (HOSPITAL_BASED_OUTPATIENT_CLINIC_OR_DEPARTMENT_OTHER): Payer: Self-pay | Admitting: Emergency Medicine

## 2016-04-11 ENCOUNTER — Emergency Department (HOSPITAL_BASED_OUTPATIENT_CLINIC_OR_DEPARTMENT_OTHER)
Admission: EM | Admit: 2016-04-11 | Discharge: 2016-04-11 | Disposition: A | Discharge status: Home / Self Care | Payer: Medicaid Other | Attending: Emergency Medicine | Admitting: Emergency Medicine

## 2016-04-11 DIAGNOSIS — B9789 Other viral agents as the cause of diseases classified elsewhere: Secondary | ICD-10-CM

## 2016-04-11 DIAGNOSIS — R05 Cough: Secondary | ICD-10-CM | POA: Diagnosis present

## 2016-04-11 DIAGNOSIS — J069 Acute upper respiratory infection, unspecified: Secondary | ICD-10-CM | POA: Diagnosis not present

## 2016-04-11 LAB — CBC WITH DIFFERENTIAL/PLATELET
Basophils Absolute: 0 10*3/uL (ref 0.0–0.1)
Basophils Relative: 0 %
Eosinophils Absolute: 0.1 10*3/uL (ref 0.0–0.7)
Eosinophils Relative: 2 %
HCT: 37.9 % (ref 36.0–46.0)
Hemoglobin: 12.3 g/dL (ref 12.0–15.0)
Lymphocytes Relative: 46 %
Lymphs Abs: 2.4 10*3/uL (ref 0.7–4.0)
MCH: 29.5 pg (ref 26.0–34.0)
MCHC: 32.5 g/dL (ref 30.0–36.0)
MCV: 90.9 fL (ref 78.0–100.0)
Monocytes Absolute: 0.4 10*3/uL (ref 0.1–1.0)
Monocytes Relative: 9 %
Neutro Abs: 2.2 10*3/uL (ref 1.7–7.7)
Neutrophils Relative %: 43 %
Platelets: 205 10*3/uL (ref 150–400)
RBC: 4.17 MIL/uL (ref 3.87–5.11)
RDW: 11.8 % (ref 11.5–15.5)
WBC: 5.1 10*3/uL (ref 4.0–10.5)

## 2016-04-11 LAB — BASIC METABOLIC PANEL
Anion gap: 4 — ABNORMAL LOW (ref 5–15)
BUN: 15 mg/dL (ref 6–20)
CO2: 27 mmol/L (ref 22–32)
Calcium: 9.3 mg/dL (ref 8.9–10.3)
Chloride: 106 mmol/L (ref 101–111)
Creatinine, Ser: 0.67 mg/dL (ref 0.44–1.00)
GFR calc Af Amer: >60 mL/min (ref >60)
GFR calc non Af Amer: >60 mL/min (ref >60)
Glucose, Bld: 87 mg/dL (ref 65–99)
Potassium: 4.2 mmol/L (ref 3.5–5.1)
Sodium: 137 mmol/L (ref 135–145)

## 2016-04-11 LAB — RAPID STREP SCREEN (MED CTR MEBANE ONLY): Streptococcus, Group A Screen (Direct): NEGATIVE

## 2016-04-11 MED ORDER — IBUPROFEN 600 MG PO TABS: 600.0000 mg | ORAL_TABLET | Freq: Four times a day (QID) | ORAL | 0 refills | Status: DC | PRN

## 2016-04-11 MED ORDER — BENZONATATE 100 MG PO CAPS: 100.0000 mg | ORAL_CAPSULE | Freq: Three times a day (TID) | ORAL | 0 refills | Status: DC

## 2016-04-11 MED ORDER — SODIUM CHLORIDE 0.9 % IV BOLUS (SEPSIS)
1000.0000 mL | Freq: Once | INTRAVENOUS | Status: AC
  Administered 2016-04-11: 1000 mL via INTRAVENOUS

## 2016-04-11 NOTE — ED Notes (Addendum)
EDP at bedside  

## 2016-04-11 NOTE — ED Triage Notes (Signed)
Cough, sore throat, body aches x 1 week, worsening yesterday.

## 2016-04-11 NOTE — Discharge Instructions (Signed)
Medications: Tessalon, ibuprofen  Treatment: Take Tessalon every 8 hours as needed for cough. Take ibuprofen every 6 hours as needed for body aches, sore throat, fever. You can alternate with Tylenol as prescribed over-the-counter. Make sure to drink plenty of water.  Follow-up: Please return to the emergency department if you develop any new or worsening symptoms.

## 2016-04-11 NOTE — ED Provider Notes (Signed)
[facility]   CSN: [id number] Arrival date & time: 04/11/16  0906     History   Chief Complaint Chief Complaint  Patient presents with  . Cough  . Sore Throat    HPI Diane Austin is a 26 y.o. female who is a previously healthy who presents with a one-week history of cough, sore throat, nasal congestion. Patient has had associated body aches, chest pain, and mild shortness of breath. Patient describes her chest pain as intermittent "sharp, shock feeling inside". It is not associated with cough or breathing. Patient also reports an achy abdominal pain along with her body aches. She has also had associated lightheadedness and dizziness, worse with standing. She denies any nausea, vomiting, diarrhea, urinary symptoms. She has not taken any medications for her symptoms. She denies any known fevers. Patient denies any recent long trips, surgeries, cancer, exogenous estrogen use, new leg pain or swelling.  HPI  History reviewed. No pertinent past medical history.  There are no active problems to display for this patient.   Past Surgical History:  Procedure Laterality Date  . CESAREAN SECTION  2015    OB History    No data available       Home Medications    Prior to Admission medications   Medication Sig Start Date End Date Taking? Authorizing Provider  benzonatate (TESSALON) 100 MG capsule Take 1 capsule (100 mg total) by mouth every 8 (eight) hours. 04/11/16   Emi Holes, PA-C  ibuprofen (ADVIL,MOTRIN) 600 MG tablet Take 1 tablet (600 mg total) by mouth every 6 (six) hours as needed. 04/11/16   Emi Holes, PA-C  metroNIDAZOLE (FLAGYL) 500 MG tablet Take 1 tablet (500 mg total) by mouth 2 (two) times daily. 09/20/15   Jaynie Crumble, PA-C    Family History No family history on file.  Social History Social History  Substance Use Topics  . Smoking status: Never Smoker  . Smokeless tobacco: Never Used  . Alcohol use No      Allergies   Patient has no known allergies.   Review of Systems Review of Systems  Constitutional: Positive for appetite change. Negative for chills and fever.  HENT: Positive for congestion, ear pain and sore throat. Negative for facial swelling.   Respiratory: Positive for cough and shortness of breath.   Cardiovascular: Positive for chest pain.  Gastrointestinal: Negative for abdominal pain, nausea and vomiting.  Genitourinary: Negative for dysuria.  Musculoskeletal: Positive for myalgias. Negative for back pain.  Skin: Negative for rash and wound.  Neurological: Positive for dizziness and light-headedness. Negative for headaches.  Psychiatric/Behavioral: The patient is not nervous/anxious.      Physical Exam Updated Vital Signs BP 98/62 (BP Location: Left Arm)   Pulse (!) 57   Temp 98.4 F (36.9 C) (Oral)   Resp 17   Ht 5\' 8"  (1.727 m)   Wt 81.6 kg   LMP 04/01/2016   SpO2 96%   BMI 27.37 kg/m   Physical Exam  Constitutional: She appears well-developed and well-nourished. No distress.  HENT:  Head: Normocephalic and atraumatic.  Right Ear: Tympanic membrane normal.  Left Ear: Tympanic membrane normal.  Mouth/Throat: Posterior oropharyngeal erythema present. No oropharyngeal exudate, posterior oropharyngeal edema or tonsillar abscesses.  Eyes: Conjunctivae are normal. Pupils are equal, round, and reactive to light. Right eye exhibits no discharge. Left eye exhibits no discharge. No scleral icterus.  Neck: Normal range of motion. Neck supple. No thyromegaly present.  Cardiovascular: Normal rate,  regular rhythm, normal heart sounds and intact distal pulses.  Exam reveals no gallop and no friction rub.   No murmur heard. Pulmonary/Chest: Effort normal. No stridor. No respiratory distress. She has decreased breath sounds. She has no wheezes. She has no rales.    Abdominal: Soft. Bowel sounds are normal. She exhibits no distension. There is no tenderness. There is  no rebound and no guarding.  Musculoskeletal: She exhibits no edema.  No calf TTP bilaterally  Lymphadenopathy:    She has no cervical adenopathy.  Neurological: She is alert. Coordination normal.  Skin: Skin is warm and dry. No rash noted. She is not diaphoretic. No pallor.  Psychiatric: She has a normal mood and affect.  Nursing note and vitals reviewed.    ED Treatments / Results  Labs (all labs ordered are listed, but only abnormal results are displayed) Labs Reviewed  BASIC METABOLIC PANEL - Abnormal; Notable for the following:       Result Value   Anion gap 4 (*)    All other components within normal limits  RAPID STREP SCREEN (NOT AT [facility])  CULTURE, GROUP A STREP [facility]  CBC WITH DIFFERENTIAL/PLATELET    EKG  EKG Interpretation  Date/Time:  Tuesday April 11 2016 09:50:09 EDT Ventricular Rate:  62 PR Interval:    QRS Duration: 82 QT Interval:  421 QTC Calculation: 428 R Axis:   49 Text Interpretation:  Sinus rhythm ST elevation suggests acute pericarditis No old tracing to compare Confirmed by Ethelda ChickJACUBOWITZ  MD, SAM [phone]([id number]) on 04/11/2016 9:59:33 AM       Radiology Dg Chest 2 View  Result Date: 04/11/2016 CLINICAL DATA:  Cough.  Body aches. EXAM: CHEST  2 VIEW COMPARISON:  No recent prior. FINDINGS: Mediastinum hilar structures normal. Lungs are clear. Heart size normal. No pleural effusion or pneumothorax. IMPRESSION: No acute cardiopulmonary disease. Electronically Signed   By: Maisie Fushomas  Register   On: 04/11/2016 09:42    Procedures Procedures (including critical care time)  Medications Ordered in ED Medications  sodium chloride 0.9 % bolus 1,000 mL (0 mLs Intravenous Stopped 04/11/16 1148)     Initial Impression / Assessment and Plan / ED Course  I have reviewed the triage vital signs and the nursing notes.  Pertinent labs & imaging results that were available during my care of the patient were reviewed by me and considered in my medical decision making (see  chart for details).     PERC negative. EKG shows NSR, ST elevation suggesting pericarditis, however chest pain is intermittent and no chest pain with laying down, no rub heard. Doubt pericarditis clinically. Suspect viral upper respiratory infection with dehydration. CXR shows no acute cardiopulmonary disease. Rapid strep negative, culture sent. CBC unremarkable. BMP unremarkable. Patient initially with orthostatic hypotension and orthostatic lightheadedness resolved after 1 L of fluids. She discharged with supportive treatment including Tessalon and ibuprofen. Patient encouraged increased fluid intake. Return precautions discussed. Patient understands and agrees with plan. Patient also evaluated by Dr. Ethelda ChickJacubowitz who got in the patient's management and agrees with plan.  Final Clinical Impressions(s) / ED Diagnoses   Final diagnoses:  Viral URI with cough    New Prescriptions Discharge Medication List as of 04/11/2016 12:00 PM    START taking these medications   Details  benzonatate (TESSALON) 100 MG capsule Take 1 capsule (100 mg total) by mouth every 8 (eight) hours., Starting Tue 04/11/2016, Print    ibuprofen (ADVIL,MOTRIN) 600 MG tablet Take 1 tablet (600 mg total) by mouth  every 6 (six) hours as needed., Starting Tue 04/11/2016, Print         Emi Holes, PA-C 04/11/16 1722    Doug Sou, MD 04/12/16 [phone]

## 2016-04-11 NOTE — ED Provider Notes (Signed)
Complains of cough productive of greenish yellow sputum and sore throat for the past one week. She also reports chest pain which lasts approximately a minute which are sharp over the substernal area for the past week pains come at random times. Not with exercises symptoms come after exercising sometimes come after rest. She denies any fever. Other associated symptoms include feeling of dehydration and generalized weakness with standing. On exam alert and in no distress H ENT exam extremities dry neck supple trachea midline lungs clear auscultation heart regular rate and rhythm no murmurs or rubs abdomen nondistended nontender extremities without edema. Clinically The patient does not have a pericarditis and symptoms consistent with influenza-like illness   Doug SouSam Josph [place], MD 04/11/16 1537

## 2016-04-13 LAB — CULTURE, GROUP A STREP (THRC)

## 2016-05-03 ENCOUNTER — Emergency Department (HOSPITAL_BASED_OUTPATIENT_CLINIC_OR_DEPARTMENT_OTHER)
Admission: EM | Admit: 2016-05-03 | Discharge: 2016-05-03 | Disposition: A | Discharge status: Home / Self Care | Payer: Medicaid Other | Attending: Emergency Medicine | Admitting: Emergency Medicine

## 2016-05-03 ENCOUNTER — Encounter (HOSPITAL_BASED_OUTPATIENT_CLINIC_OR_DEPARTMENT_OTHER): Payer: Self-pay

## 2016-05-03 DIAGNOSIS — Y990 Civilian activity done for income or pay: Secondary | ICD-10-CM | POA: Insufficient documentation

## 2016-05-03 DIAGNOSIS — Y9389 Activity, other specified: Secondary | ICD-10-CM | POA: Diagnosis not present

## 2016-05-03 DIAGNOSIS — T25222A Burn of second degree of left foot, initial encounter: Secondary | ICD-10-CM | POA: Diagnosis not present

## 2016-05-03 DIAGNOSIS — T25022A Burn of unspecified degree of left foot, initial encounter: Secondary | ICD-10-CM | POA: Diagnosis present

## 2016-05-03 DIAGNOSIS — X118XXA Contact with other hot tap-water, initial encounter: Secondary | ICD-10-CM | POA: Insufficient documentation

## 2016-05-03 DIAGNOSIS — Y929 Unspecified place or not applicable: Secondary | ICD-10-CM | POA: Diagnosis not present

## 2016-05-03 DIAGNOSIS — T300 Burn of unspecified body region, unspecified degree: Secondary | ICD-10-CM

## 2016-05-03 MED ORDER — IBUPROFEN 400 MG PO TABS
600.0000 mg | ORAL_TABLET | Freq: Once | ORAL | Status: AC
  Administered 2016-05-03: 600 mg via ORAL
  Filled 2016-05-03: qty 1

## 2016-05-03 MED ORDER — SILVER SULFADIAZINE 1 % EX CREA
TOPICAL_CREAM | Freq: Once | CUTANEOUS | Status: AC
  Administered 2016-05-03: 1 via TOPICAL
  Filled 2016-05-03: qty 85

## 2016-05-03 MED ORDER — HYDROCODONE-ACETAMINOPHEN 5-325 MG PO TABS
1.0000 | ORAL_TABLET | Freq: Once | ORAL | Status: AC
  Administered 2016-05-03: 1 via ORAL
  Filled 2016-05-03: qty 1

## 2016-05-03 MED ORDER — HYDROCODONE-ACETAMINOPHEN 5-325 MG PO TABS: 2.0000 | ORAL_TABLET | ORAL | 0 refills | Status: DC | PRN

## 2016-05-03 MED ORDER — HYDROCODONE-ACETAMINOPHEN 5-325 MG PO TABS
1.0000 | ORAL_TABLET | Freq: Once | ORAL | Status: DC
  Filled 2016-05-03: qty 1

## 2016-05-03 MED ORDER — SILVER SULFADIAZINE 1 % EX CREA: 1.0000 "application " | TOPICAL_CREAM | Freq: Every day | CUTANEOUS | 0 refills | Status: DC

## 2016-05-03 MED FILL — HYDROCODON-APAP 5-325: 5-325 | 2 days supply | Qty: 15 | Fill #0

## 2016-05-03 MED FILL — SILVADENE 1% CREAM: 1 | 30 days supply | Qty: 50 | Fill #0
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
## 2016-05-03 NOTE — Discharge Instructions (Signed)
Apply the silvadene cream once a day.   Make sure you keep the wound clean. Gently wash with soap and water. Make sure the wound is completely dry after cleaning. You can dress it with nonadhesive dressing or mesh gauze. Do not use anything that will stick to the skin.   Call the [facility] tomorrow. Their information is listed below. Arrange for an appointment in 24-48 hours. If you cannot get in you can use of the referred clinics below to be seen for wound re-eval. If you cannot get into any of these clinics come back to the ED in 48 hours for re-evaluation of the wound.   Return to the ED for any worsening redness that extends up your leg, swelling, pain, fevers or other worsening or concerning symptoms.    If you do not have a primary care doctor you see regularly, please you the list below. Please call them to arrange for follow-up.    No Primary Care Doctor Call [facility]  [phone] Other agencies that provide inexpensive medical care    [facility]  [phone]    [facility]  [phone]    [facility]  [phone]    [facility]  [phone]    [facility]  [phone]    [facility]  [phone]
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
## 2016-06-13 ENCOUNTER — Encounter (HOSPITAL_BASED_OUTPATIENT_CLINIC_OR_DEPARTMENT_OTHER): Payer: Self-pay

## 2016-06-13 ENCOUNTER — Emergency Department (HOSPITAL_BASED_OUTPATIENT_CLINIC_OR_DEPARTMENT_OTHER)
Admission: EM | Admit: 2016-06-13 | Discharge: 2016-06-13 | Disposition: A | Discharge status: Home / Self Care | Payer: Medicaid Other | Attending: Emergency Medicine | Admitting: Emergency Medicine

## 2016-06-13 DIAGNOSIS — B9689 Other specified bacterial agents as the cause of diseases classified elsewhere: Secondary | ICD-10-CM

## 2016-06-13 DIAGNOSIS — N760 Acute vaginitis: Secondary | ICD-10-CM | POA: Diagnosis not present

## 2016-06-13 DIAGNOSIS — Z87891 Personal history of nicotine dependence: Secondary | ICD-10-CM | POA: Diagnosis not present

## 2016-06-13 DIAGNOSIS — N898 Other specified noninflammatory disorders of vagina: Secondary | ICD-10-CM | POA: Diagnosis present

## 2016-06-13 LAB — WET PREP, GENITAL
SPERM: NONE SEEN
TRICH WET PREP: NONE SEEN
Yeast Wet Prep HPF POC: NONE SEEN

## 2016-06-13 LAB — URINALYSIS, ROUTINE W REFLEX MICROSCOPIC
BILIRUBIN URINE: NEGATIVE
Glucose, UA: NEGATIVE mg/dL
HGB URINE DIPSTICK: NEGATIVE
Ketones, ur: 15 mg/dL — AB
Leukocytes, UA: NEGATIVE
NITRITE: NEGATIVE
PH: 7 (ref 5.0–8.0)
Protein, ur: NEGATIVE mg/dL
SPECIFIC GRAVITY, URINE: 1.013 (ref 1.005–1.030)

## 2016-06-13 LAB — PREGNANCY, URINE: Preg Test, Ur: NEGATIVE

## 2016-06-13 MED ORDER — FLUCONAZOLE 150 MG PO TABS: 150.0000 mg | ORAL_TABLET | Freq: Once | ORAL | 0 refills | Status: AC

## 2016-06-13 MED ORDER — METRONIDAZOLE 500 MG PO TABS: 500.0000 mg | ORAL_TABLET | Freq: Two times a day (BID) | ORAL | 0 refills | Status: DC

## 2016-06-13 MED FILL — metroNIDAZOLE 500 MG TABS: 500 | 7 days supply | Qty: 14 | Fill #0

## 2016-06-13 MED FILL — FLUCONAZOLE 150 MG TABLET: 150 | 1 days supply | Qty: 1 | Fill #0

## 2016-06-13 NOTE — ED Notes (Signed)
Dr. Denton LankSteinl came out of a room, and was approached by Sue LushAndrea.  Both went to patients room for exam.

## 2016-06-13 NOTE — Discharge Instructions (Signed)
It was our pleasure to provide your ER care today - we hope that you feel better.  Take antibiotic as prescribed.  Follow up with primary care doctor in 1 week if symptoms fail to improve/resolve.  Return to ER if worse, new symptoms, fevers, severe abdominal or pelvic pain, other concern.

## 2016-06-13 NOTE — ED Triage Notes (Signed)
c/o vaginal d/c x 2 days-NAD-steady gait

## 2016-06-13 NOTE — ED Notes (Signed)
Pt came to the desk, stating she spoke to patient experience regarding her issues and needed the name of the director and the charge nurse.  Pt given both names.   Pt went back to her room.

## 2016-06-13 NOTE — ED Provider Notes (Signed)
[facility]   CSN: [id number] Arrival date & time: 06/13/16  1217     History   Chief Complaint Chief Complaint  Patient presents with  . Vaginal Discharge    HPI Diane Austin is a 26 y.o. female.  Patient c/o vaginal discharge in the past couple days. Notes hx same. Denies new sexual contact or known std exposure. No abdominal or pelvic pain. No fever or chills. No dysuria or frequency.     The history is provided by the patient.  Vaginal Discharge   Pertinent negatives include no fever, no abdominal pain and no dysuria.    History reviewed. No pertinent past medical history.  There are no active problems to display for this patient.   Past Surgical History:  Procedure Laterality Date  . CESAREAN SECTION  2015    OB History    No data available       Home Medications    Prior to Admission medications   Not on File    Family History No family history on file.  Social History Social History  Substance Use Topics  . Smoking status: Former Games developermoker  . Smokeless tobacco: Never Used  . Alcohol use No     Allergies   Patient has no known allergies.   Review of Systems Review of Systems  Constitutional: Negative for fever.  HENT: Negative for sore throat.   Eyes: Negative for redness.  Respiratory: Negative for shortness of breath.   Cardiovascular: Negative for chest pain.  Gastrointestinal: Negative for abdominal pain.  Genitourinary: Positive for vaginal discharge. Negative for dysuria, flank pain and pelvic pain.  Musculoskeletal: Negative for back pain and neck pain.  Skin: Negative for rash.  Neurological: Negative for headaches.  Hematological: Does not bruise/bleed easily.  Psychiatric/Behavioral: Negative for confusion.     Physical Exam Updated Vital Signs BP 133/62 (BP Location: Left Arm)   Pulse 83   Temp 98.3 F (36.8 C) (Oral)   Resp 16   Ht 5\' 8"  (1.727 m)   Wt 78.5 kg   LMP 05/30/2016   SpO2  100%   BMI 26.30 kg/m   Physical Exam  Constitutional: She appears well-developed and well-nourished. No distress.  HENT:  Head: Atraumatic.  Eyes: Conjunctivae are normal. No scleral icterus.  Neck: Neck supple. No tracheal deviation present.  Cardiovascular: Normal rate.   Pulmonary/Chest: Effort normal. No respiratory distress.  Abdominal: Normal appearance. She exhibits no distension. There is no tenderness.  Genitourinary:  Genitourinary Comments: No cva tenderness.  Mild vaginal discharge, whitish. No cmt. No adx masses or tenderness.   Musculoskeletal: She exhibits no edema.  Neurological: She is alert.  Skin: Skin is warm and dry. No rash noted. She is not diaphoretic.  Psychiatric: She has a normal mood and affect.  Nursing note and vitals reviewed.    ED Treatments / Results  Labs (all labs ordered are listed, but only abnormal results are displayed) Results for orders placed or performed during the hospital encounter of 06/13/16  Wet prep, genital  Result Value Ref Range   Yeast Wet Prep HPF POC NONE SEEN NONE SEEN   Trich, Wet Prep NONE SEEN NONE SEEN   Clue Cells Wet Prep HPF POC PRESENT (A) NONE SEEN   WBC, Wet Prep HPF POC MANY (A) NONE SEEN   Sperm NONE SEEN   Pregnancy, urine  Result Value Ref Range   Preg Test, Ur NEGATIVE NEGATIVE  Urinalysis, Routine w reflex microscopic  Result  Value Ref Range   Color, Urine YELLOW YELLOW   APPearance CLEAR CLEAR   Specific Gravity, Urine 1.013 1.005 - 1.030   pH 7.0 5.0 - 8.0   Glucose, UA NEGATIVE NEGATIVE mg/dL   Hgb urine dipstick NEGATIVE NEGATIVE   Bilirubin Urine NEGATIVE NEGATIVE   Ketones, ur 15 (A) NEGATIVE mg/dL   Protein, ur NEGATIVE NEGATIVE mg/dL   Nitrite NEGATIVE NEGATIVE   Leukocytes, UA NEGATIVE NEGATIVE    EKG  EKG Interpretation None       Radiology No results found.  Procedures Procedures (including critical care time)  Medications Ordered in ED Medications - No data to  display   Initial Impression / Assessment and Plan / ED Course  I have reviewed the triage vital signs and the nursing notes.  Pertinent labs & imaging results that were available during my care of the patient were reviewed by me and considered in my medical decision making (see chart for details).  Labs.  Pelvic exam.   Reviewed nursing notes and prior charts for additional history.   abd soft nt.   Wet prep with clue cells.  Will rx.  Final Clinical Impressions(s) / ED Diagnoses   Final diagnoses:  None    New Prescriptions New Prescriptions   No medications on file     Cathren Laine, MD 06/13/16 1458

## 2016-06-13 NOTE — ED Notes (Signed)
Pt came to the desk asking about a corporate number for complaint.  Asked about situation.  Pt states she has been waiting an hour for a pelvic exam.  Pt given the number for patient experience.  Sue LushAndrea, her nurse, was informed and she went to find the MD for the exam.

## 2016-06-14 LAB — GC/CHLAMYDIA PROBE AMP (~~LOC~~) NOT AT ARMC
CHLAMYDIA, DNA PROBE: NEGATIVE
Neisseria Gonorrhea: NEGATIVE

## 2016-06-27 ENCOUNTER — Ambulatory Visit (INDEPENDENT_AMBULATORY_CARE_PROVIDER_SITE_OTHER): Payer: Medicaid Other | Admitting: Podiatry

## 2016-06-27 ENCOUNTER — Encounter: Payer: Self-pay | Admitting: Podiatry

## 2016-06-27 ENCOUNTER — Ambulatory Visit (HOSPITAL_BASED_OUTPATIENT_CLINIC_OR_DEPARTMENT_OTHER)
Admission: RE | Admit: 2016-06-27 | Discharge: 2016-06-27 | Disposition: A | Discharge status: Home / Self Care | Payer: Medicaid Other | Source: Ambulatory Visit | Attending: Podiatry | Admitting: Podiatry

## 2016-06-27 DIAGNOSIS — M79671 Pain in right foot: Secondary | ICD-10-CM | POA: Diagnosis present

## 2016-06-27 DIAGNOSIS — M2041 Other hammer toe(s) (acquired), right foot: Secondary | ICD-10-CM

## 2016-06-27 DIAGNOSIS — M79672 Pain in left foot: Secondary | ICD-10-CM | POA: Insufficient documentation

## 2016-06-27 DIAGNOSIS — R52 Pain, unspecified: Secondary | ICD-10-CM

## 2016-06-27 DIAGNOSIS — M2042 Other hammer toe(s) (acquired), left foot: Secondary | ICD-10-CM

## 2016-06-27 DIAGNOSIS — L84 Corns and callosities: Secondary | ICD-10-CM

## 2016-06-27 NOTE — Patient Instructions (Signed)

## 2016-06-27 NOTE — Progress Notes (Signed)
Subjective:    Patient ID: Diane Austin, female   DOB: 26 y.o.   MRN: [id number]   HPI 26 year old female presents the office today for concerns of painful corns the top of both of her feet along all the toes of 2 through 5. She states this been ongoing for last couple years has become very painful basis. She has tried offloading, padding, shoe gear modifications without any symptom relief. She has seen other physicians for this previously injected using more definitive to help decrease her pain and deformity. She denies any recent injury or trauma. No numbness or tingling. The pain does not wake her up at night. She has been using corn removing pads which made the symptoms worse. Denies any claudication symptoms. She denies any history or family history of any blood clots or bleeding disorders. She has no other concerns today.   Review of Systems  All other systems reviewed and are negative.       Objective:  Physical Exam General: AAO x3, NAD  Dermatological: Hyperkeratotic lesions present on the dorsal aspect of the PIPJ of digits 2 through 5 bilaterally. There is no open lesions within 5 this time. There is no swelling erythema or edema or any signs of infection. No open lesions or pre-ulcerative lesions at this time.  Vascular: Dorsalis Pedis artery and Posterior Tibial artery pedal pulses are 2/4 bilateral with immedate capillary fill time. PThere is no pain with calf compression, swelling, warmth, erythema.   Neruologic: Grossly intact via light touch bilateral. Vibratory intact via tuning fork bilateral. Protective threshold with Semmes Wienstein monofilament intact to all pedal sites bilateral.   Musculoskeletal: Decrease in medial arch has present upon weightbearing. Flexible reducible hammertoe contractures are present digits 2 through 5 adductovarus present the fifth digit. There is tenderness on the dorsal aspect of the PIPJ of lesser digits 2 through 5 of the left side worse than  the right long hyperkeratotic lesions. There is no other area of tenderness at this time. Muscular strength 5/5 in all groups tested bilateral.  Gait: Unassisted, Nonantalgic.      Assessment:     Hyperkeratotic lesions due to underlying hammertoe deformity    Plan:      -Treatment options discussed including all alternatives, risks, and complications -Etiology of symptoms were discussed -X-rays were obtained and reviewed with the patient.  -At this time a discussed both conservative and surgical treatment options the patient. She is attended numerous conservative treatment the ascending improvement symptoms and she is requesting surgical intervention at this time. I discussed with her surgical options as well as the surgeries as above course. We discussed arthroplasty digits 2, 3, 4, bilateral foot. She wishes to proceed with surgery. I discussed with this is not a guarantee of resolution of symptoms and can make area worse or have more deformity to the toes. She understands this and wishes to proceed. -The incision placement as well as the postoperative course was discussed with the patient. I discussed risks of the surgery which include, but not limited to, infection, bleeding, pain, swelling, need for further surgery, delayed or nonhealing, painful or ugly scar, numbness or sensation changes, over/under correction, recurrence, transfer lesions, further deformity, hardware failure, DVT/PE, loss of toe/foot. Patient understands these risks and wishes to proceed with surgery. The surgical consent was reviewed with the patient all 3 pages were signed. No promises or guarantees were given to the outcome of the procedure. All questions were answered to the best of my ability. Before  the surgery the patient was encouraged to call the office if there is any further questions. [facility].  Ovid CurdMatthew Kinta Martis, DPM

## 2016-06-29 ENCOUNTER — Telehealth: Payer: Self-pay | Admitting: Podiatry

## 2016-06-29 ENCOUNTER — Telehealth: Payer: Self-pay | Admitting: *Deleted

## 2016-06-29 NOTE — Telephone Encounter (Signed)
"  I was supposed to call you for a surgical appointment with Dr. Ovid CurdMatthew Wagoner.  Give me a call back.  Again I'm trying to call and schedule an appointment for foot surgery."

## 2016-06-29 NOTE — Telephone Encounter (Signed)
Patient called wanting to know how far out can she schedule her surgery for. I told her if she has already signed the consent forms and its within six months she would be okay but if it is after six months she would have to come back in.

## 2016-06-30 NOTE — Telephone Encounter (Signed)
I'm returning your call.  You want to schedule surgery?  "Yes, I would like to do it the day before Thanksgiving."  I can't guarantee that Dr. Ardelle AntonWagoner will be doing surgery on that date.  "Okay, let's do it on November 28."  I'll get it scheduled.  You can register with the surgical center at your convenience.

## 2016-12-19 ENCOUNTER — Telehealth: Payer: Self-pay | Admitting: *Deleted

## 2016-12-19 NOTE — Telephone Encounter (Signed)
Please send letter.

## 2016-12-19 NOTE — Telephone Encounter (Signed)
"  We have been trying to call this patient about her insurance.  She has Medicaid Family, that will not cover surgery.  Her phone is disconnected.  Do you have another number we can reach her at?"  No, I'll try to call patient as well.  "Please let me know what you find out."   I attempted to call the patient to inquire about her insurance.  She is scheduled for surgery on November 28.  She has Medicaid Family plan.  Her phone service was inactive.

## 2016-12-20 ENCOUNTER — Encounter: Payer: Self-pay | Admitting: *Deleted

## 2016-12-20 NOTE — Telephone Encounter (Signed)
I sent a letter to the patient stating her appointment was canceled for 12/27/2016.  I informed her we needed updated demographics as well as insurance information.

## 2017-01-02 ENCOUNTER — Encounter: Payer: Self-pay | Admitting: Podiatry

## 2017-01-09 ENCOUNTER — Encounter: Payer: Self-pay | Admitting: Podiatry

## 2017-04-06 ENCOUNTER — Other Ambulatory Visit: Payer: Self-pay

## 2017-04-06 ENCOUNTER — Encounter (HOSPITAL_BASED_OUTPATIENT_CLINIC_OR_DEPARTMENT_OTHER): Payer: Self-pay | Admitting: *Deleted

## 2017-04-06 ENCOUNTER — Emergency Department (HOSPITAL_BASED_OUTPATIENT_CLINIC_OR_DEPARTMENT_OTHER)
Admission: EM | Admit: 2017-04-06 | Discharge: 2017-04-06 | Disposition: A | Discharge status: Home / Self Care | Payer: Medicaid Other | Attending: Emergency Medicine | Admitting: Emergency Medicine

## 2017-04-06 DIAGNOSIS — Z3A12 12 weeks gestation of pregnancy: Secondary | ICD-10-CM | POA: Diagnosis not present

## 2017-04-06 DIAGNOSIS — O9989 Other specified diseases and conditions complicating pregnancy, childbirth and the puerperium: Secondary | ICD-10-CM | POA: Insufficient documentation

## 2017-04-06 DIAGNOSIS — Z87891 Personal history of nicotine dependence: Secondary | ICD-10-CM | POA: Insufficient documentation

## 2017-04-06 DIAGNOSIS — M79645 Pain in left finger(s): Secondary | ICD-10-CM | POA: Diagnosis not present

## 2017-04-06 MED ORDER — PRENATAL MULTIVITAMIN CH: 1.0000 | ORAL_TABLET | Freq: Every day | ORAL | 0 refills | Status: AC

## 2017-04-06 NOTE — Discharge Instructions (Signed)
Follow attached handout.  Take Tylenol for pain. Avoid NSAIDs such as ibuprofen and Naproxen.  Follow up with your OBGYN during your scheduled appointment this Tuesday.  If you develop worsening or new concerning symptoms you can return to the emergency department for re-evaluation.

## 2017-04-06 NOTE — ED Provider Notes (Signed)
[facility] EMERGENCY DEPARTMENT Provider Note   CSN: [id number] Arrival date & time: 04/06/17  1742     History   Chief Complaint No chief complaint on file.   HPI Diane Austin is a 27 y.o. female who is currently [redacted] weeks pregnant who presents emergency department today for left index finger pain.  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 Since increasing her level of work she has been having pain over the 2nd digit of the left, non-dominant hand. She notes that it is worse with movement. Reports minimal swelling. She has not taken anything for her symptoms as she is currently [redacted] weeks pregnant and did not want to harm the pregnancy. She denies history of sickle cell disease, fever, trauma, injury, numbness, tingling or weakness.   HPI  History reviewed. No pertinent past medical history.  There are no active problems to display for this patient.   Past Surgical History:  Procedure Laterality Date  . CESAREAN SECTION  2015    OB History    Gravida Para Term Preterm AB Living   1             SAB TAB Ectopic Multiple Live Births                   Home Medications    Prior to Admission medications   Medication Sig Start Date End Date Taking? Authorizing Provider  metroNIDAZOLE (FLAGYL) 500 MG tablet Take 1 tablet (500 mg total) by mouth 2 (two) times daily. 06/13/16   Cathren Laine, MD    Family History No family history on file.  Social History Social History   Tobacco Use  . Smoking status: Former Games developer  . Smokeless tobacco: Never Used  Substance Use Topics  . Alcohol use: No  . Drug use: No     Allergies   Patient has no known allergies.   Review of Systems Review of Systems  All other systems reviewed and are negative.    Physical Exam Updated Vital Signs BP 101/69   Pulse 86   Temp 98.7 F (37.1 C) (Oral)   Resp 18   Ht 5\' 8"  (1.727 m)   Wt 94.8 kg (209 lb)   LMP 02/27/2017    SpO2 100%   BMI 31.78 kg/m   Physical Exam  Constitutional: She appears well-developed and well-nourished.  HENT:  Head: Normocephalic and atraumatic.  Right Ear: External ear normal.  Left Ear: External ear normal.  Eyes: Conjunctivae are normal. Right eye exhibits no discharge. Left eye exhibits no discharge. No scleral icterus.  Cardiovascular:  Pulses:      Radial pulses are 2+ on the left side.  Pulmonary/Chest: Effort normal. No respiratory distress.  Musculoskeletal:  Left hand: No gross deformities, skin intact. Fingers appear normal. No TTP over flexor sheath. TTP over MCP, PIP and DIP. No joint swelling.  No evidence of paronychia, felon.  Patient's finger nails are painted and unable to assess if there is a subungual hematoma.  Unlikely given no trauma..Finger adduction/abduction intact with 5/5 strength.  Thumb opposition intact. Full active and resisted ROM to flexion/extension at wrist, MCP, PIP and DIP of all fingers. Patient notes some pain with these movements. FDS/FDP intact. Radial artery 2+ with <2sec cap refill. SILT in M/U/R distributions. Grip 5/5 strength.   Neurological: She is alert.  Skin: Skin is warm and dry. No abrasion and no laceration noted. No  erythema. No pallor.  Psychiatric: She has a normal mood and affect.  Nursing note and vitals reviewed.    ED Treatments / Results  Labs (all labs ordered are listed, but only abnormal results are displayed) Labs Reviewed - No data to display  EKG  EKG Interpretation None       Radiology No results found.  Procedures Procedures (including critical care time)  Medications Ordered in ED Medications - No data to display   Initial Impression / Assessment and Plan / ED Course  I have reviewed the triage vital signs and the nursing notes.  Pertinent labs & imaging results that were available during my care of the patient were reviewed by me and considered in my medical decision making (see chart for  details).     27 year old female that presents to the emergency department today for left, non-dominant index finger pain that began after the patient recently started working on an assembly line.  Patient notes pain is worse with range of motion.  She is neurovascularly intact.  There is no evidence of joint swelling or infection.  Feel patient needs x-ray at this time as there is no indication of trauma.  Patient symptoms likely result of sprain.  She is requesting note for work and will provide. Patient is currently [redacted] weeks pregnant.  Will advise Tylenol for pain and to avoid NSAIDs.  Patient is not currently taking prenatal vitamins.  Will prescribe.  She is to follow-up with OB on Tuesday.  Splint provided in the department.  Specific return precautions discussed. Time was given for all questions to be answered. The patient verbalized understanding and agreement with plan. The patient appears safe for discharge home.   Final Clinical Impressions(s) / ED Diagnoses   Final diagnoses:  Pain of finger of left hand    ED Discharge Orders        Ordered    Prenatal Vit-Fe Fumarate-FA (PRENATAL MULTIVITAMIN) TABS tablet  Daily     04/06/17 1953       Princella PellegriniMaczis, Jaidence Geisler M, PA-C 04/06/17 2332    Nira Connardama, Pedro Eduardo, MD 04/06/17 [phone]

## 2017-04-06 NOTE — ED Triage Notes (Signed)
Swelling to her left index finger x 3 days. No known injury. States she is [redacted] weeks pregnant and did not want to take medication for the swelling without seeing a doctor first.

## 2017-06-14 ENCOUNTER — Other Ambulatory Visit: Payer: Self-pay

## 2017-06-14 ENCOUNTER — Encounter (HOSPITAL_BASED_OUTPATIENT_CLINIC_OR_DEPARTMENT_OTHER): Payer: Self-pay | Admitting: Adult Health

## 2017-06-14 ENCOUNTER — Emergency Department (HOSPITAL_BASED_OUTPATIENT_CLINIC_OR_DEPARTMENT_OTHER)
Admission: EM | Admit: 2017-06-14 | Discharge: 2017-06-14 | Disposition: A | Discharge status: Other Acute Inpatient Hospital | Payer: Medicaid Other | Attending: Physician Assistant | Admitting: Physician Assistant

## 2017-06-14 DIAGNOSIS — Z3A20 20 weeks gestation of pregnancy: Secondary | ICD-10-CM | POA: Diagnosis not present

## 2017-06-14 DIAGNOSIS — Z79899 Other long term (current) drug therapy: Secondary | ICD-10-CM | POA: Diagnosis not present

## 2017-06-14 DIAGNOSIS — Z87891 Personal history of nicotine dependence: Secondary | ICD-10-CM | POA: Diagnosis not present

## 2017-06-14 LAB — ABO/RH: ABO/RH(D): O POS

## 2017-06-14 LAB — COMPREHENSIVE METABOLIC PANEL
ALT: 22 U/L (ref 14–54)
AST: 22 U/L (ref 15–41)
Albumin: 3.4 g/dL — ABNORMAL LOW (ref 3.5–5.0)
Alkaline Phosphatase: 70 U/L (ref 38–126)
Anion gap: 9 (ref 5–15)
BUN: 9 mg/dL (ref 6–20)
CO2: 20 mmol/L — ABNORMAL LOW (ref 22–32)
Calcium: 8.7 mg/dL — ABNORMAL LOW (ref 8.9–10.3)
Chloride: 104 mmol/L (ref 101–111)
Creatinine, Ser: 0.54 mg/dL (ref 0.44–1.00)
GFR calc Af Amer: >60 mL/min (ref >60)
GFR calc non Af Amer: >60 mL/min (ref >60)
Glucose, Bld: 75 mg/dL (ref 65–99)
Potassium: 3.2 mmol/L — ABNORMAL LOW (ref 3.5–5.1)
Sodium: 133 mmol/L — ABNORMAL LOW (ref 135–145)
Total Bilirubin: 0.2 mg/dL — ABNORMAL LOW (ref 0.3–1.2)
Total Protein: 7 g/dL (ref 6.5–8.1)

## 2017-06-14 LAB — CBC
HCT: 31.5 % — ABNORMAL LOW (ref 36.0–46.0)
Hemoglobin: 10.8 g/dL — ABNORMAL LOW (ref 12.0–15.0)
MCH: 30.3 pg (ref 26.0–34.0)
MCHC: 34.3 g/dL (ref 30.0–36.0)
MCV: 88.5 fL (ref 78.0–100.0)
Platelets: 241 10*3/uL (ref 150–400)
RBC: 3.56 MIL/uL — ABNORMAL LOW (ref 3.87–5.11)
RDW: 12.5 % (ref 11.5–15.5)
WBC: 9.2 10*3/uL (ref 4.0–10.5)

## 2017-06-14 MED ORDER — LACTATED RINGERS IV BOLUS
1000.0000 mL | Freq: Once | INTRAVENOUS | Status: AC
Start: 2017-06-14 — End: 2017-06-14
  Administered 2017-06-14: 1000 mL via INTRAVENOUS

## 2017-06-14 MED ORDER — LACTATED RINGERS IV SOLN
INTRAVENOUS | Status: DC
  Administered 2017-06-14: 18:00:00 via INTRAVENOUS

## 2017-06-14 NOTE — ED Notes (Addendum)
ED Provider at bedside, performing U/S, fetal cardiac activity positive

## 2017-06-14 NOTE — ED Notes (Signed)
Awaiting EMS for transport

## 2017-06-14 NOTE — ED Notes (Signed)
Spoke with Renae Fickle at [place].

## 2017-06-14 NOTE — Progress Notes (Signed)
G1P0 at unknown gestational age with [facility] reports to [facility] for c/o vaginal pressure and abdominal pain.  Talked with Sophie RN.  Was informed that pt was being transferred to [facility] and the MD on call for her practice has excepted her transfer.  Tracing maternal at time of call.  Will call RN when FHT's seen on monitor.

## 2017-06-14 NOTE — ED Triage Notes (Signed)
Pt presents with gravid abdomen, fundus felt above umbilicus. PT reprots vaginal pressure, bleeding a discharge since Monday.

## 2017-06-14 NOTE — ED Provider Notes (Signed)
[facility] EMERGENCY DEPARTMENT Provider Note   CSN: [id number] Arrival date & time: 06/14/17  1703     History   Chief Complaint Chief Complaint  Patient presents with  . Abdominal Pain    HPI Diane Austin is a 27 y.o. female.  HPI   27 year old female G2, P1 presenting at 20 weeks with active contractions.  Patient reports that she is been leaking fluid and blood for the last week.  She had a follow-up appointment in a couple days with her OB/GYN so is waiting until then.  Today work she started to develop contractions.  On arrival here projections were roughly  2 to 4 minutes apart.  History reviewed. No pertinent past medical history.  There are no active problems to display for this patient.   Past Surgical History:  Procedure Laterality Date  . CESAREAN SECTION  2015     OB History    Gravida  1   Para      Term      Preterm      AB      Living        SAB      TAB      Ectopic      Multiple      Live Births               Home Medications    Prior to Admission medications   Medication Sig Start Date End Date Taking? Authorizing Provider  metroNIDAZOLE (FLAGYL) 500 MG tablet Take 1 tablet (500 mg total) by mouth 2 (two) times daily. 06/13/16   Cathren Laine, MD  Prenatal Vit-Fe Fumarate-FA (PRENATAL MULTIVITAMIN) TABS tablet Take 1 tablet by mouth daily at 12 noon. 04/06/17   Maczis, Elmer Sow, PA-C    Family History History reviewed. No pertinent family history.  Social History Social History   Tobacco Use  . Smoking status: Former Games developer  . Smokeless tobacco: Never Used  Substance Use Topics  . Alcohol use: No  . Drug use: No     Allergies   Patient has no known allergies.   Review of Systems Review of Systems  Unable to perform ROS: Acuity of condition     Physical Exam Updated Vital Signs BP 124/77 (BP Location: Left Arm)   Pulse (!) 104   Temp 98.2 F (36.8 C) (Oral)   Resp 18   LMP 02/27/2017  Comment: Gravida 2, Para 1   SpO2 100%   Physical Exam  Constitutional: She is oriented to person, place, and time. She appears well-developed and well-nourished.  HENT:  Head: Normocephalic and atraumatic.  Eyes: Conjunctivae are normal. Right eye exhibits no discharge.  Neck: Neck supple.  Cardiovascular: Regular rhythm and normal heart sounds.  No murmur heard. tachycardia  Pulmonary/Chest: Effort normal and breath sounds normal. She has no wheezes. She has no rales.  Abdominal: Soft. She exhibits no distension.  Gravid, fundus above umbilicus.  Contractions felt  Genitourinary:  Genitourinary Comments: deferred  Musculoskeletal: Normal range of motion. She exhibits no edema.  Neurological: She is oriented to person, place, and time. No cranial nerve deficit.  Skin: Skin is warm and dry. No rash noted. She is not diaphoretic.  Psychiatric: She has a normal mood and affect. Her behavior is normal.  Nursing note and vitals reviewed.    ED Treatments / Results  Labs (all labs ordered are listed, but only abnormal results are displayed) Labs Reviewed  CBC - Abnormal; Notable for  the following components:      Result Value   RBC 3.56 (*)    Hemoglobin 10.8 (*)    HCT 31.5 (*)    All other components within normal limits  COMPREHENSIVE METABOLIC PANEL - Abnormal; Notable for the following components:   Sodium 133 (*)    Potassium 3.2 (*)    CO2 20 (*)    Calcium 8.7 (*)    Albumin 3.4 (*)    Total Bilirubin 0.2 (*)    All other components within normal limits  ABO/RH    EKG None  Radiology No results found.  Procedures Procedures (including critical care time)  CRITICAL CARE Performed by: Arlana Hove Total critical care time: 30 minutes Critical care time was exclusive of separately billable procedures and treating other patients. Critical care was necessary to treat or prevent imminent or life-threatening deterioration. Critical care was time spent  personally by me on the following activities: development of treatment plan with patient and/or surrogate as well as nursing, discussions with consultants, evaluation of patient's response to treatment, examination of patient, obtaining history from patient or surrogate, ordering and performing treatments and interventions, ordering and review of laboratory studies, ordering and review of radiographic studies, pulse oximetry and re-evaluation of patient's condition.  Ultrasound prefomed, baby moving, heart beating. adequete fluid.     Medications Ordered in ED Medications  lactated ringers bolus 1,000 mL (0 mLs Intravenous Stopped 06/14/17 1800)     Initial Impression / Assessment and Plan / ED Course  I have reviewed the triage vital signs and the nursing notes.  Pertinent labs & imaging results that were available during my care of the patient were reviewed by me and considered in my medical decision making (see chart for details).     27 year old female G2, P1 presenting at 20 weeks with active contractions.  Patient reports that she is been leaking fluid and blood for the last week.  She had a follow-up appointment in a couple days with her OB/GYN so is waiting until then.  Today work she started to develop contractions.  On arrival here projections were roughly  2 to 4 minutes apart.  Immediately called for transfer patient at [facility].  Page Dr. Rito Ehrlich patient's primary OB/GYN.  He recommended no treatment at the current moment and immediate transfer.  Discussed tocolytic and decision made  to hold off until patient arrived at [facility].    Final Clinical Impressions(s) / ED Diagnoses   Final diagnoses:  None    ED Discharge Orders    None       Abelino Derrick, MD 06/14/17 2313

## 2017-06-14 NOTE — ED Notes (Signed)
Spoke with Carollee Herter OB rapid response RN.

## 2017-06-14 NOTE — Progress Notes (Addendum)
Bedside US completed per ER MD with positive cardiac activity at 178.  Difficult to trace FHT's by ER department.  Has been cleared to transfer to [facility] between [facility] and [facility] ED.  Dr Jolayne Panther updated on pt status and transfer.

## 2018-03-01 ENCOUNTER — Emergency Department (HOSPITAL_BASED_OUTPATIENT_CLINIC_OR_DEPARTMENT_OTHER): Payer: Medicaid Other

## 2018-03-01 ENCOUNTER — Encounter (HOSPITAL_BASED_OUTPATIENT_CLINIC_OR_DEPARTMENT_OTHER): Payer: Self-pay | Admitting: *Deleted

## 2018-03-01 ENCOUNTER — Other Ambulatory Visit: Payer: Self-pay

## 2018-03-01 ENCOUNTER — Emergency Department (HOSPITAL_BASED_OUTPATIENT_CLINIC_OR_DEPARTMENT_OTHER)
Admission: EM | Admit: 2018-03-01 | Discharge: 2018-03-01 | Disposition: A | Discharge status: Home / Self Care | Payer: Medicaid Other | Attending: Emergency Medicine | Admitting: Emergency Medicine

## 2018-03-01 DIAGNOSIS — Z79899 Other long term (current) drug therapy: Secondary | ICD-10-CM | POA: Insufficient documentation

## 2018-03-01 DIAGNOSIS — Z87891 Personal history of nicotine dependence: Secondary | ICD-10-CM | POA: Diagnosis not present

## 2018-03-01 DIAGNOSIS — B9789 Other viral agents as the cause of diseases classified elsewhere: Secondary | ICD-10-CM

## 2018-03-01 DIAGNOSIS — J069 Acute upper respiratory infection, unspecified: Secondary | ICD-10-CM | POA: Insufficient documentation

## 2018-03-01 DIAGNOSIS — R05 Cough: Secondary | ICD-10-CM | POA: Diagnosis present

## 2018-03-01 MED ORDER — BENZONATATE 100 MG PO CAPS: 100.0000 mg | ORAL_CAPSULE | Freq: Three times a day (TID) | ORAL | 0 refills | Status: DC

## 2018-03-01 MED ORDER — BENZONATATE 100 MG PO CAPS: 100.0000 mg | ORAL_CAPSULE | Freq: Three times a day (TID) | ORAL | 0 refills | Status: AC

## 2018-03-01 MED ORDER — GUAIFENESIN ER 600 MG PO TB12: 600.0000 mg | ORAL_TABLET | Freq: Two times a day (BID) | ORAL | 0 refills | Status: AC

## 2018-03-01 MED ORDER — GUAIFENESIN ER 600 MG PO TB12: 600.0000 mg | ORAL_TABLET | Freq: Two times a day (BID) | ORAL | 0 refills | Status: DC

## 2018-03-01 NOTE — ED Provider Notes (Signed)
[facility] EMERGENCY DEPARTMENT Provider Note   CSN: [id number] Arrival date & time: 03/01/18  1359     History   Chief Complaint Chief Complaint  Patient presents with  . Cough    HPI Diane Austin is a 28 y.o. female.  HPI   Pt is a 28 y/o female who presents to the ED today for evaluation of a cough. Pt states that her sxs started after being exposed to her infant with RSV 2 months ago. Initially had chills and sweats but that has resolved. Describes cough as productive of green mucous. States symptoms have waxed and waned over the last few months. She also reports rhinorrhea, nasal congestion, and intermittent sneezing. Has tried taking theraflu, nyquil, and benadryl with no relief. Tried mucinex which did improve sxs. Tried to see her ob-gyn about sxs and was told to come to urgent care.   No shortness of breath or chest pain. No LE swelling or pain with inspiration. Not on birth control. No recent surgeries, admission to the hospital, long trips. No h/o VTE or CA.  History reviewed. No pertinent past medical history.  There are no active problems to display for this patient.   Past Surgical History:  Procedure Laterality Date  . CESAREAN SECTION  2015     OB History    Gravida  1   Para      Term      Preterm      AB      Living        SAB      TAB      Ectopic      Multiple      Live Births               Home Medications    Prior to Admission medications   Medication Sig Start Date End Date Taking? Authorizing Provider  benzonatate (TESSALON) 100 MG capsule Take 1 capsule (100 mg total) by mouth every 8 (eight) hours for 5 days. 03/01/18 03/06/18  Shyvonne Chastang S, PA-C  guaiFENesin (MUCINEX) 600 MG 12 hr tablet Take 1 tablet (600 mg total) by mouth 2 (two) times daily for 5 days. 03/01/18 03/06/18  Marcin Holte S, PA-C  Prenatal Vit-Fe Fumarate-FA (PRENATAL MULTIVITAMIN) TABS tablet Take 1 tablet by mouth daily at 12 noon. 04/06/17    Maczis, Elmer Sow, PA-C    Family History History reviewed. No pertinent family history.  Social History Social History   Tobacco Use  . Smoking status: Former Games developer  . Smokeless tobacco: Never Used  Substance Use Topics  . Alcohol use: No  . Drug use: No     Allergies   Patient has no known allergies.   Review of Systems Review of Systems  Constitutional: Negative for chills and fever.  HENT: Positive for congestion and rhinorrhea. Negative for ear pain and sore throat.   Eyes: Negative for visual disturbance.  Respiratory: Positive for cough. Negative for shortness of breath and wheezing.   Cardiovascular: Negative for chest pain.  Gastrointestinal: Negative for abdominal pain and vomiting.  Genitourinary: Negative for dysuria and hematuria.  Musculoskeletal: Negative for back pain and myalgias.  Skin: Negative for rash.  Neurological: Negative for syncope.  All other systems reviewed and are negative.    Physical Exam Updated Vital Signs BP 114/71   Pulse 98   Temp 98.5 F (36.9 C)   Resp 18   Ht 5\' 8"  (1.727 m)   Wt 104.3 kg  LMP 03/01/2018   SpO2 100%   Breastfeeding Unknown   BMI 34.97 kg/m   Physical Exam Vitals signs and nursing note reviewed.  Constitutional:      General: She is not in acute distress.    Appearance: She is well-developed. She is not ill-appearing or toxic-appearing.  HENT:     Head: Normocephalic and atraumatic.  Eyes:     Conjunctiva/sclera: Conjunctivae normal.  Neck:     Musculoskeletal: Neck supple.  Cardiovascular:     Rate and Rhythm: Normal rate and regular rhythm.     Heart sounds: Normal heart sounds. No murmur.  Pulmonary:     Effort: Pulmonary effort is normal. No respiratory distress.     Breath sounds: Normal breath sounds. No stridor. No wheezing or rhonchi.     Comments: Speaking in full sentences without tachypnea Abdominal:     General: Bowel sounds are normal.     Palpations: Abdomen is soft.      Tenderness: There is no abdominal tenderness.  Skin:    General: Skin is warm and dry.  Neurological:     Mental Status: She is alert.  Psychiatric:        Mood and Affect: Mood normal.      ED Treatments / Results  Labs (all labs ordered are listed, but only abnormal results are displayed) Labs Reviewed - No data to display  EKG None  Radiology Dg Chest 2 View  Result Date: 03/01/2018 CLINICAL DATA:  Initial evaluation for acute productive cough. EXAM: CHEST - 2 VIEW COMPARISON:  Prior radiograph from 04/11/2016. FINDINGS: The cardiac and mediastinal silhouettes are stable in size and contour, and remain within normal limits. The lungs are normally inflated. No airspace consolidation, pleural effusion, or pulmonary edema is identified. There is no pneumothorax. No acute osseous abnormality identified. IMPRESSION: No radiographic evidence for active cardiopulmonary disease. Electronically Signed   By: Rise MuBenjamin  McClintock M.D.   On: 03/01/2018 14:39    Procedures Procedures (including critical care time)  Medications Ordered in ED Medications - No data to display   Initial Impression / Assessment and Plan / ED Course  I have reviewed the triage vital signs and the nursing notes.  Pertinent labs & imaging results that were available during my care of the patient were reviewed by me and considered in my medical decision making (see chart for details).    Final Clinical Impressions(s) / ED Diagnoses   Final diagnoses:  Viral URI with cough   Pt CXR negative for acute infiltrate. Patients symptoms are consistent with URI, likely viral etiology. Less likely flu as patient is not having fevers, body aches, sore throat. Less likely PE as she is perc negative, not sob, and does not have pleuritic pain.  Discussed that antibiotics are not indicated for viral infections. Pt will be discharged with symptomatic treatment.  Verbalizes understanding and is agreeable with plan. Pt is  hemodynamically stable & in NAD prior to dc.  ED Discharge Orders         Ordered    benzonatate (TESSALON) 100 MG capsule  Every 8 hours     03/01/18 1509    guaiFENesin (MUCINEX) 600 MG 12 hr tablet  2 times daily     03/01/18 1509           Ardelia Wrede S, PA-C 03/01/18 1510    Shaune PollackIsaacs, Cameron, MD 03/04/18 1006

## 2018-03-01 NOTE — Discharge Instructions (Addendum)
If you were given a prescription, please take the prescription as you were instructed and follow the directions given on the discharge paperwork.   Over the next several days you should rest as much as possible, and drink more fluids than usual. Liquids will help thin and loosen mucus so you can cough it up. Liquids will also help prevent dehydration. Using a cool mist humidifier or a vaporizer to increase air moisture in your home can also make it easier for you to breathe and help decrease your cough.  Please follow up with your primary care provider within 5-7 days for re-evaluation of your symptoms. If you do not have a primary care provider, information for a healthcare clinic has been provided for you to make arrangements for follow up care. Please return to the emergency department for any persistent fevers, worsening sore throat/hoarse voice, inability to swallow, persistent vomiting, chest pain, shortness of breath, coughing up blood, or any new or worsening symptoms.

## 2018-03-01 NOTE — ED Triage Notes (Signed)
Pt c/o  Pro cough x 2 months

## 2018-05-01 IMAGING — CR DG CHEST 2V
2 series · 2 of 2 positions shown · non-contrast
Comparison: No recent prior.

CLINICAL DATA: Cough.  Body aches.

EXAM:
CHEST  2 VIEW

[w chest pa]
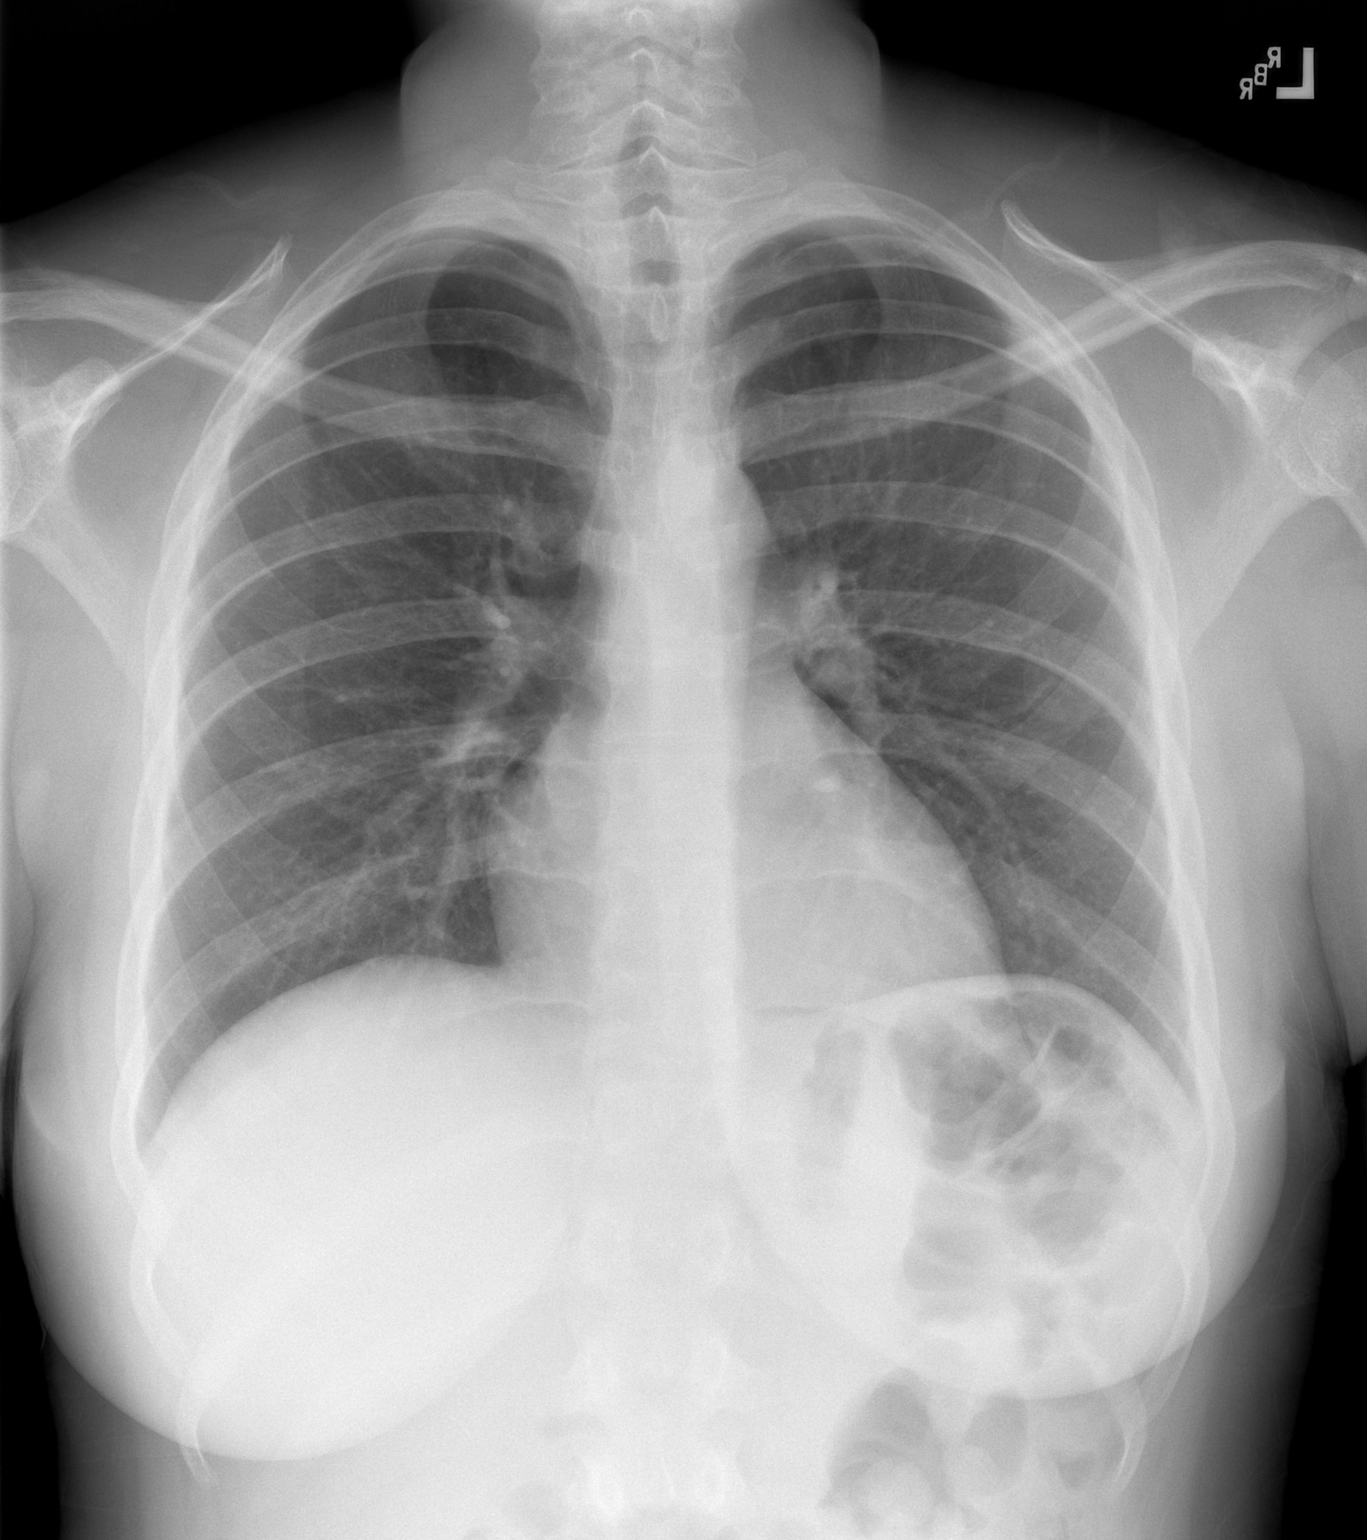

[w chest lat]
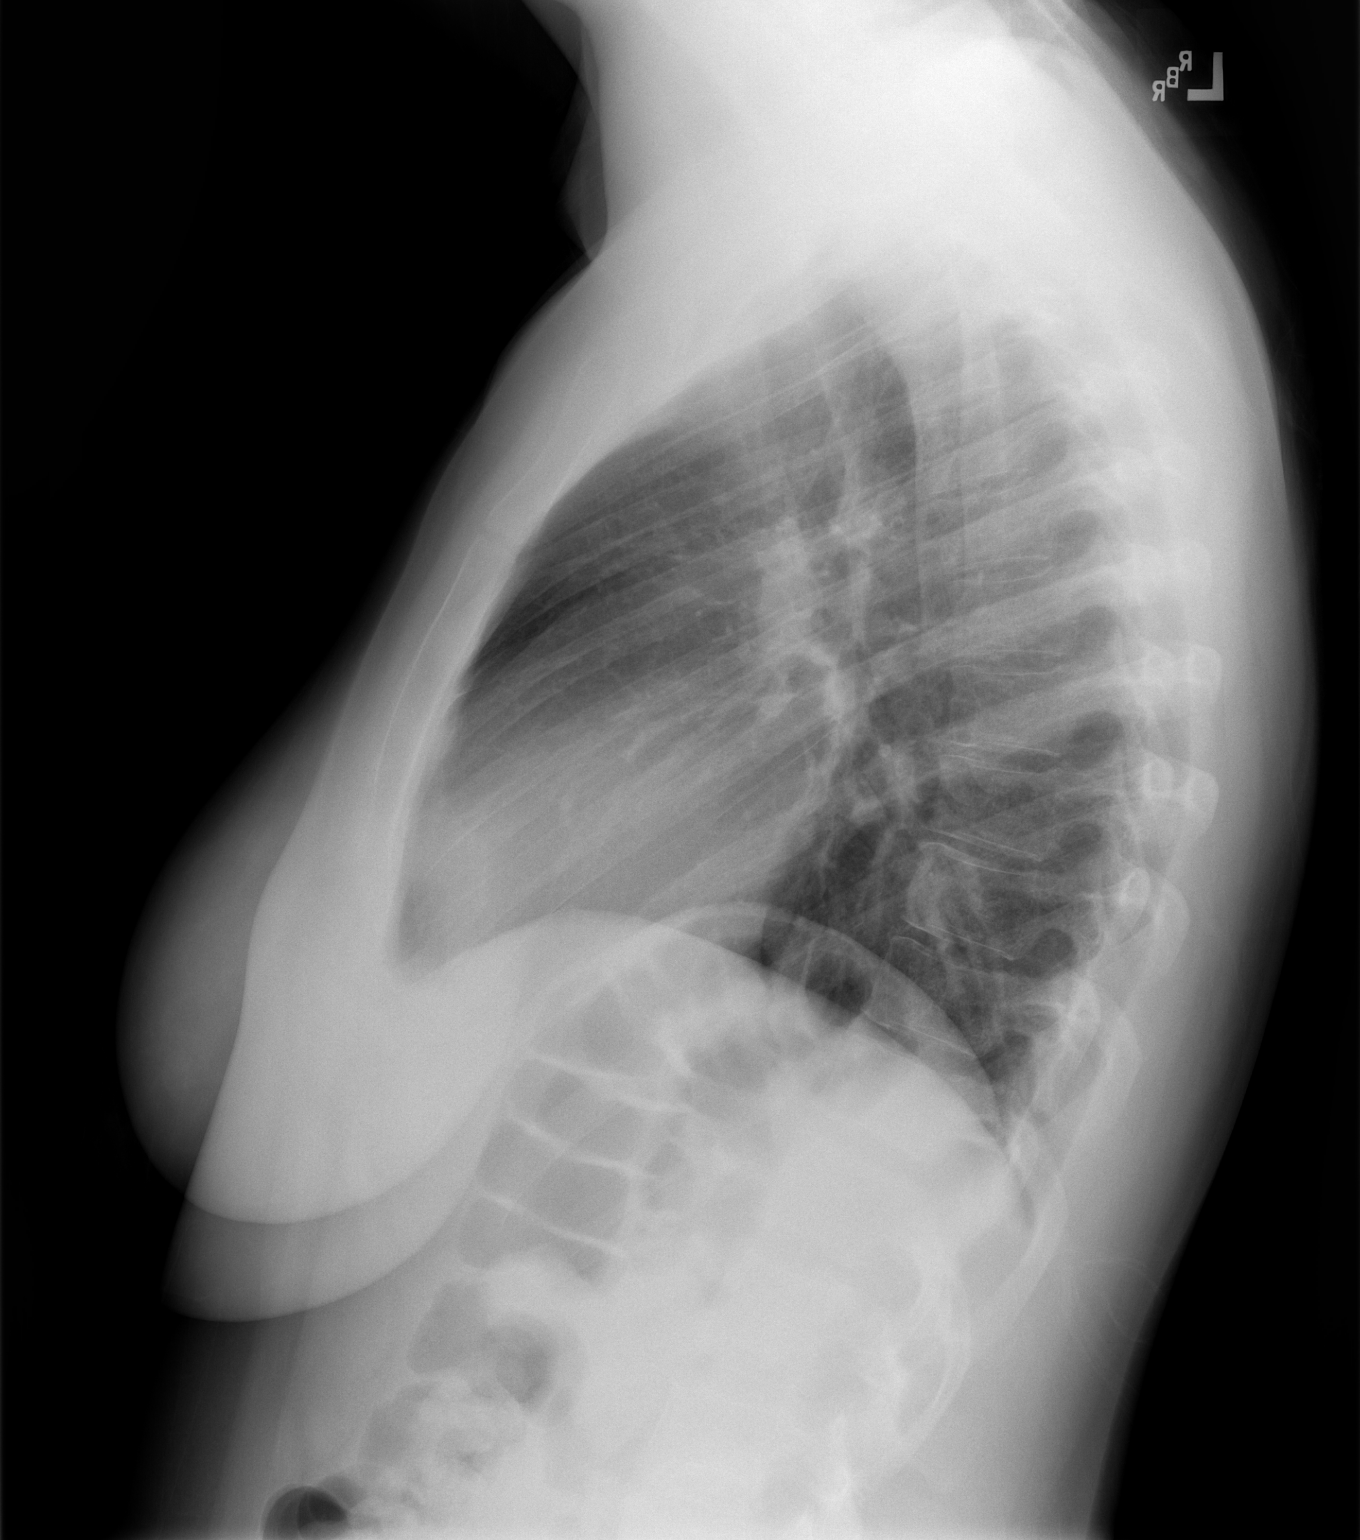

[2 of 2 positions shown; findings below may reference images not displayed]

FINDINGS: Mediastinum hilar structures normal. Lungs are clear. Heart size
normal. No pleural effusion or pneumothorax.
IMPRESSION: No acute cardiopulmonary disease.

## 2018-05-22 ENCOUNTER — Other Ambulatory Visit: Payer: Self-pay

## 2018-05-22 ENCOUNTER — Emergency Department (HOSPITAL_BASED_OUTPATIENT_CLINIC_OR_DEPARTMENT_OTHER): Payer: Medicaid Other

## 2018-05-22 ENCOUNTER — Emergency Department (HOSPITAL_BASED_OUTPATIENT_CLINIC_OR_DEPARTMENT_OTHER)
Admission: EM | Admit: 2018-05-22 | Discharge: 2018-05-22 | Disposition: A | Discharge status: Home / Self Care | Payer: Medicaid Other | Attending: Emergency Medicine | Admitting: Emergency Medicine

## 2018-05-22 DIAGNOSIS — T148XXA Other injury of unspecified body region, initial encounter: Secondary | ICD-10-CM

## 2018-05-22 DIAGNOSIS — Y939 Activity, unspecified: Secondary | ICD-10-CM | POA: Insufficient documentation

## 2018-05-22 DIAGNOSIS — Z87891 Personal history of nicotine dependence: Secondary | ICD-10-CM | POA: Diagnosis not present

## 2018-05-22 DIAGNOSIS — S29012A Strain of muscle and tendon of back wall of thorax, initial encounter: Secondary | ICD-10-CM | POA: Insufficient documentation

## 2018-05-22 DIAGNOSIS — S299XXA Unspecified injury of thorax, initial encounter: Secondary | ICD-10-CM | POA: Diagnosis present

## 2018-05-22 DIAGNOSIS — Y92410 Unspecified street and highway as the place of occurrence of the external cause: Secondary | ICD-10-CM | POA: Diagnosis not present

## 2018-05-22 DIAGNOSIS — Y999 Unspecified external cause status: Secondary | ICD-10-CM | POA: Insufficient documentation

## 2018-05-22 LAB — PREGNANCY, URINE: Preg Test, Ur: NEGATIVE

## 2018-05-22 MED ORDER — NAPROXEN 500 MG PO TABS: 500.0000 mg | ORAL_TABLET | Freq: Two times a day (BID) | ORAL | 0 refills | Status: DC | PRN

## 2018-05-22 MED ORDER — ACETAMINOPHEN 500 MG PO TABS
1000.0000 mg | ORAL_TABLET | Freq: Once | ORAL | Status: AC
  Administered 2018-05-22: 13:00:00 1000 mg via ORAL
  Filled 2018-05-22: qty 2
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
## 2018-08-16 DIAGNOSIS — B9689 Other specified bacterial agents as the cause of diseases classified elsewhere: Secondary | ICD-10-CM | POA: Diagnosis not present

## 2018-08-16 DIAGNOSIS — Z113 Encounter for screening for infections with a predominantly sexual mode of transmission: Secondary | ICD-10-CM | POA: Diagnosis not present

## 2018-08-16 DIAGNOSIS — N760 Acute vaginitis: Secondary | ICD-10-CM | POA: Diagnosis not present

## 2018-08-16 DIAGNOSIS — N898 Other specified noninflammatory disorders of vagina: Secondary | ICD-10-CM | POA: Diagnosis not present

## 2018-09-06 DIAGNOSIS — Z1159 Encounter for screening for other viral diseases: Secondary | ICD-10-CM | POA: Diagnosis not present

## 2018-09-06 DIAGNOSIS — Z03818 Encounter for observation for suspected exposure to other biological agents ruled out: Secondary | ICD-10-CM | POA: Diagnosis not present

## 2018-09-06 DIAGNOSIS — J988 Other specified respiratory disorders: Secondary | ICD-10-CM | POA: Diagnosis not present

## 2018-09-19 DIAGNOSIS — N760 Acute vaginitis: Secondary | ICD-10-CM | POA: Diagnosis not present

## 2018-09-19 DIAGNOSIS — B9689 Other specified bacterial agents as the cause of diseases classified elsewhere: Secondary | ICD-10-CM | POA: Diagnosis not present

## 2018-09-23 ENCOUNTER — Emergency Department (HOSPITAL_BASED_OUTPATIENT_CLINIC_OR_DEPARTMENT_OTHER)
Admission: EM | Admit: 2018-09-23 | Discharge: 2018-09-23 | Disposition: A | Discharge status: Home / Self Care | Payer: Medicaid Other | Attending: Emergency Medicine | Admitting: Emergency Medicine

## 2018-09-23 ENCOUNTER — Encounter (HOSPITAL_BASED_OUTPATIENT_CLINIC_OR_DEPARTMENT_OTHER): Payer: Self-pay | Admitting: *Deleted

## 2018-09-23 ENCOUNTER — Other Ambulatory Visit: Payer: Self-pay

## 2018-09-23 DIAGNOSIS — Z87891 Personal history of nicotine dependence: Secondary | ICD-10-CM | POA: Diagnosis not present

## 2018-09-23 DIAGNOSIS — G43909 Migraine, unspecified, not intractable, without status migrainosus: Secondary | ICD-10-CM | POA: Insufficient documentation

## 2018-09-23 DIAGNOSIS — Z0289 Encounter for other administrative examinations: Secondary | ICD-10-CM | POA: Diagnosis not present

## 2018-09-23 NOTE — ED Triage Notes (Signed)
States work sent her home because her work note had been lost  Needs a new work note

## 2018-09-23 NOTE — Discharge Instructions (Signed)
As we discussed you need to establish care with a primary care doctor.  I have provided to clinics for you to follow-up with.

## 2018-09-23 NOTE — ED Provider Notes (Signed)
[facility] EMERGENCY DEPARTMENT Provider Note   CSN: [id number] Arrival date & time: 09/23/18  1526     History   Chief Complaint Chief Complaint  Patient presents with  . Work note    HPI Diane Austin is a 28 y.o. female who presents for evaluation of wanting a work note.  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  She states that she has had her cartilage pierced for 2 years help with migraines.  She states that she needs an work note stating that the drill he has to stay in.  She states that she had a note on file but states that the management company recently switched and they must have lost her note.  She denies any complaints at this time.     The history is provided by the patient.    History reviewed. No pertinent past medical history.  There are no active problems to display for this patient.   Past Surgical History:  Procedure Laterality Date  . CESAREAN SECTION  2015     OB History    Gravida  1   Para      Term      Preterm      AB      Living        SAB      TAB      Ectopic      Multiple      Live Births               Home Medications    Prior to Admission medications   Medication Sig Start Date End Date Taking? Authorizing Provider  naproxen (NAPROSYN) 500 MG tablet Take 1 tablet (500 mg total) by mouth 2 (two) times daily as needed. 05/22/18   Ward, Chase PicketJaime Pilcher, PA-C  Prenatal Vit-Fe Fumarate-FA (PRENATAL MULTIVITAMIN) TABS tablet Take 1 tablet by mouth daily at 12 noon. 04/06/17   Maczis, Elmer SowMichael M, PA-C    Family History No family history on file.  Social History Social History   Tobacco Use  . Smoking status: Former Games developermoker  . Smokeless tobacco: Never Used  Substance Use Topics  . Alcohol use: No  . Drug use: No     Allergies   Patient has no known allergies.   Review of Systems Review of Systems  All other systems reviewed and are negative.    Physical Exam Updated Vital Signs BP  111/70 (BP Location: Left Arm)   Pulse 67   Temp 98.8 F (37.1 C) (Oral)   Resp 18   Ht 5' 7.5" (1.715 m)   Wt 84.4 kg   LMP 09/13/2018 (Exact Date)   SpO2 100%   BMI 28.70 kg/m   Physical Exam Vitals signs and nursing note reviewed.  Constitutional:      Appearance: She is well-developed.  HENT:     Head: Normocephalic and atraumatic.  Eyes:     General: No scleral icterus.       Right eye: No discharge.        Left eye: No discharge.     Conjunctiva/sclera: Conjunctivae normal.  Pulmonary:     Effort: Pulmonary effort is normal.  Skin:    General: Skin is warm and dry.  Neurological:     Mental Status: She is alert.  Psychiatric:        Speech: Speech normal.        Behavior: Behavior normal.      ED Treatments /  Results  Labs (all labs ordered are listed, but only abnormal results are displayed) Labs Reviewed - No data to display  EKG None  Radiology No results found.  Procedures Procedures (including critical care time)  Medications Ordered in ED Medications - No data to display   Initial Impression / Assessment and Plan / ED Course  I have reviewed the triage vital signs and the nursing notes.  Pertinent labs & imaging results that were available during my care of the patient were reviewed by me and considered in my medical decision making (see chart for details).         28 year old female who presents for evaluation of wanting a work note.  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  She states that she must have her cartilage pierced for medical reasons.  She states that she is not able to take her piercings out and states that she needs them for migraine control.  She states that she needs either a work note stating that she cannot work or a work note stating that she needs her tragus pierced for medical necessity.  She has no complaints at this time.  She states that there is record in our system regarding her migraines.  Patient is afebrile, non-toxic appearing, sitting comfortably on examination table. Vital signs reviewed and stable.   I reviewed patient's record did not see any evidence of encounter for migraine.  I discussed with patient regarding her need for work note and that this is not within the scope of ED.  I discussed with patient she will need to obtain primary care doctor for further evaluation.  We will give her short course of work note to help with establishing PCP. At this time, patient exhibits no emergent life-threatening condition that require further evaluation in ED. Patient had ample opportunity for questions and discussion. All patient's questions were answered with full understanding. Strict return precautions discussed. Patient expresses understanding and agreement to plan.   Portions of this note were generated with Lobbyist. Dictation errors may occur despite best attempts at proofreading.  Final Clinical Impressions(s) / ED Diagnoses   Final diagnoses:  Encounter to obtain excuse from work    ED Discharge Orders    None       Desma Mcgregor 09/23/18 2142    Charlesetta Shanks, MD 10/09/18 [phone]

## 2019-02-25 DIAGNOSIS — B373 Candidiasis of vulva and vagina: Secondary | ICD-10-CM | POA: Diagnosis not present

## 2019-02-25 DIAGNOSIS — F418 Other specified anxiety disorders: Secondary | ICD-10-CM | POA: Diagnosis not present

## 2019-02-25 DIAGNOSIS — N760 Acute vaginitis: Secondary | ICD-10-CM | POA: Diagnosis not present

## 2019-02-25 DIAGNOSIS — Z3202 Encounter for pregnancy test, result negative: Secondary | ICD-10-CM | POA: Diagnosis not present

## 2019-05-29 ENCOUNTER — Encounter (HOSPITAL_BASED_OUTPATIENT_CLINIC_OR_DEPARTMENT_OTHER): Payer: Self-pay | Admitting: *Deleted

## 2019-05-29 ENCOUNTER — Emergency Department (HOSPITAL_BASED_OUTPATIENT_CLINIC_OR_DEPARTMENT_OTHER): Payer: Medicaid Other

## 2019-05-29 ENCOUNTER — Emergency Department (HOSPITAL_BASED_OUTPATIENT_CLINIC_OR_DEPARTMENT_OTHER)
Admission: EM | Admit: 2019-05-29 | Discharge: 2019-05-29 | Disposition: A | Discharge status: Home / Self Care | Payer: Medicaid Other | Attending: Emergency Medicine | Admitting: Emergency Medicine

## 2019-05-29 ENCOUNTER — Other Ambulatory Visit: Payer: Self-pay

## 2019-05-29 DIAGNOSIS — M5412 Radiculopathy, cervical region: Secondary | ICD-10-CM | POA: Diagnosis not present

## 2019-05-29 DIAGNOSIS — Y999 Unspecified external cause status: Secondary | ICD-10-CM | POA: Diagnosis not present

## 2019-05-29 DIAGNOSIS — X509XXA Other and unspecified overexertion or strenuous movements or postures, initial encounter: Secondary | ICD-10-CM | POA: Insufficient documentation

## 2019-05-29 DIAGNOSIS — Z79899 Other long term (current) drug therapy: Secondary | ICD-10-CM | POA: Insufficient documentation

## 2019-05-29 DIAGNOSIS — Y9389 Activity, other specified: Secondary | ICD-10-CM | POA: Diagnosis not present

## 2019-05-29 DIAGNOSIS — Z87891 Personal history of nicotine dependence: Secondary | ICD-10-CM | POA: Insufficient documentation

## 2019-05-29 DIAGNOSIS — M25512 Pain in left shoulder: Secondary | ICD-10-CM

## 2019-05-29 DIAGNOSIS — Y9289 Other specified places as the place of occurrence of the external cause: Secondary | ICD-10-CM | POA: Diagnosis not present

## 2019-05-29 MED ORDER — KETOROLAC TROMETHAMINE 30 MG/ML IJ SOLN
30.0000 mg | Freq: Once | INTRAMUSCULAR | Status: AC
  Administered 2019-05-29: 30 mg via INTRAMUSCULAR
  Filled 2019-05-29: qty 1

## 2019-05-29 MED ORDER — NAPROXEN 500 MG PO TABS: 500.0000 mg | ORAL_TABLET | Freq: Two times a day (BID) | ORAL | 0 refills | Status: AC | PRN

## 2019-05-29 MED ORDER — METHOCARBAMOL 500 MG PO TABS: 500.0000 mg | ORAL_TABLET | Freq: Two times a day (BID) | ORAL | 0 refills | Status: AC

## 2019-05-29 MED ORDER — PREDNISONE 50 MG PO TABS
60.0000 mg | ORAL_TABLET | Freq: Once | ORAL | Status: AC
  Administered 2019-05-29: 60 mg via ORAL
  Filled 2019-05-29: qty 1

## 2019-05-29 MED ORDER — METHYLPREDNISOLONE 4 MG PO TBPK
ORAL_TABLET | ORAL | 0 refills | Status: AC
Start: 2019-05-29 — End: ?

## 2019-05-29 MED ORDER — METHOCARBAMOL 500 MG PO TABS
500.0000 mg | ORAL_TABLET | Freq: Once | ORAL | Status: AC
  Administered 2019-05-29: 500 mg via ORAL
  Filled 2019-05-29: qty 1

## 2019-05-29 MED FILL — METHOCARBAMOL 500 MG TABLET: 500 | 10 days supply | Qty: 20 | Fill #0

## 2019-05-29 MED FILL — METHYLPREDNISOLONE 4 MG TBP: 4 | 6 days supply | Qty: 21 | Fill #0

## 2019-05-29 MED FILL — NAPROXEN 500 MG TABS: 500 | 15 days supply | Qty: 30 | Fill #0

## 2019-05-29 NOTE — Discharge Instructions (Signed)
Your x-rays today are reassuring, I suspect pain is due to muscle spasm, but it also seems like you may have a pinched nerve or nerve inflammation coming from your neck.  Please take naproxen twice daily to help with pain and inflammation, take steroid Dosepak as directed to help with inflammation as well and use muscle relaxer as needed.  This can cause some drowsiness so do not take before driving.  Please follow-up with Dr. Jordan Likes with sports medicine if symptoms are not improving.

## 2019-05-29 NOTE — ED Triage Notes (Signed)
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 She feels she pulled a muscle. Limited movement.

## 2019-05-29 NOTE — ED Notes (Signed)
Pt requesting a sling. Discussed with EDP. EDP will discuss with pt.

## 2019-05-29 NOTE — ED Provider Notes (Signed)
[facility] EMERGENCY DEPARTMENT Provider Note   CSN: [id number] Arrival date & time: 05/29/19  1109     History Chief Complaint  Patient presents with  . Shoulder Pain    Diane Austin is a 29 y.o. female.  Diane Austin is a 29 y.o. female who is otherwise healthy, presents to the ED for evaluation of left shoulder pain.  She reports pain began yesterday while she was at work.  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  She states that she felt like she may have pulled or strained a muscle.  Pain starts over the top of her shoulder and radiates up into the left side of her neck.  She states that this morning when she woke up she had some tingling in her first 3 fingers.  Her employer told her that she needed to get checked out before she could come back to work.  She denies focal neck pain or trauma to the neck.  No weakness in the left arm.  She reports range of motion is decreased due to pain.  She has not taken anything to treat her symptoms prior to arrival.  Denies history of similar in the past.  No other aggravating or alleviating factors.        History reviewed. No pertinent past medical history.  There are no problems to display for this patient.   Past Surgical History:  Procedure Laterality Date  . CESAREAN SECTION  2015     OB History    Gravida  1   Para      Term      Preterm      AB      Living        SAB      TAB      Ectopic      Multiple      Live Births              No family history on file.  Social History   Tobacco Use  . Smoking status: Former Games developer  . Smokeless tobacco: Never Used  Substance Use Topics  . Alcohol use: No  . Drug use: No    Home Medications Prior to Admission medications   Medication Sig Start Date End Date Taking? Authorizing Provider  methocarbamol (ROBAXIN) 500 MG tablet Take 1 tablet (500 mg total) by mouth 2 (two) times daily.  05/29/19   Dartha Lodge, PA-C  methylPREDNISolone (MEDROL DOSEPAK) 4 MG TBPK tablet Take as directed 05/29/19   Dartha Lodge, PA-C  naproxen (NAPROSYN) 500 MG tablet Take 1 tablet (500 mg total) by mouth 2 (two) times daily as needed. 05/29/19   Dartha Lodge, PA-C  Prenatal Vit-Fe Fumarate-FA (PRENATAL MULTIVITAMIN) TABS tablet Take 1 tablet by mouth daily at 12 noon. 04/06/17   Maczis, Elmer Sow, PA-C    Allergies    Patient has no known allergies.  Review of Systems   Review of Systems  Constitutional: Negative for chills and fever.  Musculoskeletal: Positive for arthralgias. Negative for joint swelling.  Skin: Negative for color change, rash and wound.  Neurological: Negative for weakness and numbness.    Physical Exam Updated Vital Signs BP 123/76 (BP Location: Right Arm)   Pulse 61   Temp 98.2 F (36.8 C) (Oral)   Resp 15   Ht 5\' 8"  (1.727 m)   Wt 92.5 kg   LMP  05/26/2019   SpO2 100%   BMI 31.02 kg/m   Physical Exam Vitals and nursing note reviewed.  Constitutional:      General: She is not in acute distress.    Appearance: Normal appearance. She is well-developed and normal weight. She is not ill-appearing or diaphoretic.  HENT:     Head: Normocephalic and atraumatic.  Eyes:     General:        Right eye: No discharge.        Left eye: No discharge.  Pulmonary:     Effort: Pulmonary effort is normal. No respiratory distress.  Musculoskeletal:     Comments: Tenderness to palpation in the distribution of the trapezius muscle on the left with palpable spasm.  Patient also has some tenderness over the left paraspinal muscles.  Range of motion of the shoulder is limited due to pain, but she has no swelling, erythema, warmth or deformity.  Normal range of motion at the elbow and wrist.  2+ radial pulse, good cap refill, 5/5 strength in the left hand. Positive Spurling maneuver on the left  Skin:    General: Skin is warm and dry.  Neurological:     Mental Status: She  is alert and oriented to person, place, and time.     Coordination: Coordination normal.  Psychiatric:        Mood and Affect: Mood normal.        Behavior: Behavior normal.     ED Results / Procedures / Treatments   Labs (all labs ordered are listed, but only abnormal results are displayed) Labs Reviewed - No data to display  EKG None  Radiology DG Shoulder Left  Result Date: 05/29/2019 CLINICAL DATA:  Pain after heavy lifting EXAM: LEFT SHOULDER - 2+ VIEW COMPARISON:  None. FINDINGS: Oblique and Y scapular images were obtained. No fracture or dislocation. Joint spaces appear normal. No erosive change or intra-articular calcification. Visualized left IMPRESSION: No fracture or dislocation.  No evident arthropathy. Electronically Signed   By: Bretta Bang III M.D.   On: 05/29/2019 11:54    Procedures Procedures (including critical care time)  Medications Ordered in ED Medications  ketorolac (TORADOL) 30 MG/ML injection 30 mg (30 mg Intramuscular Given 05/29/19 1223)  predniSONE (DELTASONE) tablet 60 mg (60 mg Oral Given 05/29/19 1223)  methocarbamol (ROBAXIN) tablet 500 mg (500 mg Oral Given 05/29/19 1222)    ED Course  I have reviewed the triage vital signs and the nursing notes.  Pertinent labs & imaging results that were available during my care of the patient were reviewed by me and considered in my medical decision making (see chart for details).    MDM Rules/Calculators/A&P                     29 year old female presents with left shoulder and neck pain after work yesterday.  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  Think she may have strained a muscle.  On exam she has tenderness and palpable spasm in the trapezius muscle but also has left paraspinal tenderness with positive Spurling maneuver.  Reports that she woke up with some tingling in her first 3 fingers that has since improved, she has no weakness in the left arm.  Denies direct injury or  trauma to the neck.  Shoulder x-rays with no acute fracture or abnormality.  Suspect muscle spasm and cervical radiculopathy will treat with NSAIDs, muscle relaxers and steroids and have patient follow-up with  sports medicine.  I have asked her to avoid heavy lifting or strenuous movement for the next few days.  Return precautions discussed.  Patient expresses understanding and agreement.  Discharged home in good condition.  Final Clinical Impression(s) / ED Diagnoses Final diagnoses:  Acute pain of left shoulder  Cervical radiculopathy    Rx / DC Orders ED Discharge Orders         Ordered    naproxen (NAPROSYN) 500 MG tablet  2 times daily PRN     05/29/19 1330    methocarbamol (ROBAXIN) 500 MG tablet  2 times daily     05/29/19 1330    methylPREDNISolone (MEDROL DOSEPAK) 4 MG TBPK tablet     05/29/19 1330           Benedetto Goad [place], [place] 05/29/19 1331    Wyvonnia Dusky, MD 05/29/19 1753

## 2019-06-03 NOTE — Progress Notes (Signed)
Diane Austin - 29 y.o. female MRN [id number]  Date of birth: 09/11/1990  SUBJECTIVE:  Including CC & ROS.  Chief Complaint  Patient presents with  . Shoulder Pain    left shoulder x 05/28/2019    Diane Austin is a 29 y.o. female that is presenting with left trapezius pain.  The pain occurred while she was at work.  She was lifting a heavy object and felt the area become significantly painful.  She is evaluated emergency department.  She has been trying medications with limited improvement.  The pain seems to be ongoing.  She is fairly active on a regular basis.  She denies any pain like this similarly.  Denies any radicular symptoms..  Independent review of the left shoulder x-ray from 4/29 shows no changes.   Review of Systems See HPI   HISTORY: Past Medical, Surgical, Social, and Family History Reviewed & Updated per EMR.   Pertinent Historical Findings include:  No past medical history on file.  Past Surgical History:  Procedure Laterality Date  . CESAREAN SECTION  2015    No family history on file.  Social History   Socioeconomic History  . Marital status: Single    Spouse name: Not on file  . Number of children: Not on file  . Years of education: Not on file  . Highest education level: Not on file  Occupational History  . Not on file  Tobacco Use  . Smoking status: Former Research scientist (life sciences)  . Smokeless tobacco: Never Used  Substance and Sexual Activity  . Alcohol use: No  . Drug use: No  . Sexual activity: Not on file  Other Topics Concern  . Not on file  Social History Narrative  . Not on file   Social Determinants of Health   Financial Resource Strain:   . Difficulty of Paying Living Expenses:   Food Insecurity:   . Worried About Charity fundraiser in the Last Year:   . Arboriculturist in the Last Year:   Transportation Needs:   . Film/video editor (Medical):   Marland Kitchen Lack of Transportation (Non-Medical):   Physical Activity:   . Days of Exercise per Week:    . Minutes of Exercise per Session:   Stress:   . Feeling of Stress :   Social Connections:   . Frequency of Communication with Friends and Family:   . Frequency of Social Gatherings with Friends and Family:   . Attends Religious Services:   . Active Member of Clubs or Organizations:   . Attends Archivist Meetings:   Marland Kitchen Marital Status:   Intimate Partner Violence:   . Fear of Current or Ex-Partner:   . Emotionally Abused:   Marland Kitchen Physically Abused:   . Sexually Abused:      PHYSICAL EXAM:  VS: BP 120/75   Pulse 69   Ht 5\' 8"  (1.727 m)   Wt 200 lb (90.7 kg)   LMP 05/26/2019   BMI 30.41 kg/m  Physical Exam Gen: NAD, alert, cooperative with exam, well-appearing MSK:  Left shoulder/neck: Tenderness throughout the trapezius. Normal passive flexion abduction. Normal internal and external rotation. Normal empty can test. No winging of the scapula. Normal neck range of motion. Neurovascularly intact  Limited ultrasound: Left shoulder:  Biceps tendon appears normal in short and long axis. Normal-appearing subscapularis. Normal appearing supraspinatus and static and dynamic testing. No effusion posterior glenohumeral joint. No structural changes within the trapezius  Summary: No structural findings on ultrasound.  Ultrasound and interpretation by Clare Gandy, MD   Aspiration/Injection Procedure Note [facility] [birth year]-1992  Procedure: Injection Indications: Left trigger points  Procedure Details Consent: Risks of procedure as well as the alternatives and risks of each were explained to the (patient/caregiver).  Consent for procedure obtained. Time Out: Verified patient identification, verified procedure, site/side was marked, verified correct patient position, special equipment/implants available, medications/allergies/relevent history reviewed, required imaging and test results available.  Performed.  The area was cleaned with iodine and alcohol swabs.     The left trapezius and rhomboids was injected using 1 cc's of 40 mg Depo-Medrol and 4 cc's of 0.25% bupivacaine with a 25 1" needle.  Ultrasound was used.     A sterile dressing was applied.  Patient did tolerate procedure well.     ASSESSMENT & PLAN:   Myofascial pain Sustained while at work.  Having significant pain in the trapezius and periscapular region.  Seems less likely as the shoulder being the responsible party. -Counseled on home exercise therapy and supportive care. -Provided ibuprofen. -Trigger point injections. -If no improvement can consider imaging or physical therapy. -Provided work note.

## 2019-06-04 ENCOUNTER — Encounter: Payer: Self-pay | Admitting: Family Medicine

## 2019-06-04 ENCOUNTER — Ambulatory Visit: Payer: Self-pay

## 2019-06-04 ENCOUNTER — Other Ambulatory Visit: Payer: Self-pay

## 2019-06-04 ENCOUNTER — Ambulatory Visit: Payer: Medicaid Other | Admitting: Family Medicine

## 2019-06-04 VITALS — BP 120/75 | HR 69 | Ht 68.0 in | Wt 200.0 lb

## 2019-06-04 DIAGNOSIS — M7918 Myalgia, other site: Secondary | ICD-10-CM | POA: Insufficient documentation

## 2019-06-04 DIAGNOSIS — M25512 Pain in left shoulder: Secondary | ICD-10-CM

## 2019-06-04 MED ORDER — IBUPROFEN 600 MG PO TABS: 600.0000 mg | ORAL_TABLET | Freq: Three times a day (TID) | ORAL | 0 refills | Status: AC | PRN

## 2019-06-04 MED ORDER — METHYLPREDNISOLONE ACETATE 40 MG/ML IJ SUSP
40.0000 mg | Freq: Once | INTRAMUSCULAR | Status: AC
  Administered 2019-06-04: 12:00:00 40 mg via INTRA_ARTICULAR

## 2019-06-04 MED FILL — IBUPROFEN 600 MG TABLET: 600 | 10 days supply | Qty: 30 | Fill #0

## 2019-06-04 NOTE — Assessment & Plan Note (Signed)
Sustained while at work.  Having significant pain in the trapezius and periscapular region.  Seems less likely as the shoulder being the responsible party. -Counseled on home exercise therapy and supportive care. -Provided ibuprofen. -Trigger point injections. -If no improvement can consider imaging or physical therapy. -Provided work note.

## 2019-06-04 NOTE — Patient Instructions (Signed)
Nice to meet you  Please try heat  Please try the exercises  Please use the ibuprofen as needed  Please call me Friday morning to let know how you're doing  Please send me a message in MyChart with any questions or updates.  Please see me back in 2-4 weeks.   --Dr. Jordan Likes

## 2019-06-06 ENCOUNTER — Telehealth: Payer: Self-pay | Admitting: Family Medicine

## 2019-06-06 NOTE — Telephone Encounter (Signed)
Patient calling to report progress after injection on 5/5. She states overall she is feeling a little worse than before the injection. States the morning after the injection she woke up with a cramp in her left shoulder which she has not experienced before. She is experiencing more soreness since the injection and is still having numbness in her left hand which is preventing her from being able to drive.

## 2019-06-09 NOTE — Telephone Encounter (Signed)
Paperwork completed.   Myra Rude, MD [facility] 06/09/2019, 8:50 AM

## 2019-06-09 NOTE — Telephone Encounter (Signed)
Patient informed of paperwork being completed

## 2019-06-18 ENCOUNTER — Ambulatory Visit: Payer: Medicaid Other | Admitting: Family Medicine

## 2019-06-18 ENCOUNTER — Encounter: Payer: Self-pay | Admitting: Family Medicine

## 2019-06-18 ENCOUNTER — Other Ambulatory Visit: Payer: Self-pay

## 2019-06-18 VITALS — Ht 68.0 in | Wt 203.0 lb

## 2019-06-18 DIAGNOSIS — M7918 Myalgia, other site: Secondary | ICD-10-CM

## 2019-06-18 DIAGNOSIS — M5412 Radiculopathy, cervical region: Secondary | ICD-10-CM | POA: Insufficient documentation

## 2019-06-18 NOTE — Assessment & Plan Note (Addendum)
Has been getting some improvement with her pain from the trigger point injections.  Does have abnormal sensations down the hand which are more radicular in nature. -Counseled on home exercise therapy and supportive care. -Referral to physical therapy.

## 2019-06-18 NOTE — Patient Instructions (Signed)
Good to see you Please try heat  Physical therapy will give you a call   Please send me a message in MyChart with any questions or updates.  Please see [place] back as needed or in 4 weeks.   --Dr. Jordan Likes

## 2019-06-18 NOTE — Progress Notes (Signed)
  Diane Austin - 29 y.o. female MRN [id number]  Date of birth: Nov 06, 1990  SUBJECTIVE:  Including CC & ROS.  Chief Complaint  Patient presents with  . Follow-up    left shoulder    Diane Austin is a 29 y.o. female that is following up for her left trapezius pain.  The trigger point injections have slowly decreased her pain.  She still feels it intermittently.  She still has an abnormal sensation going down the left arm and into the palm.   Review of Systems See HPI   HISTORY: Past Medical, Surgical, Social, and Family History Reviewed & Updated per EMR.   Pertinent Historical Findings include:  No past medical history on file.  Past Surgical History:  Procedure Laterality Date  . CESAREAN SECTION  2015    No family history on file.  Social History   Socioeconomic History  . Marital status: Single    Spouse name: Not on file  . Number of children: Not on file  . Years of education: Not on file  . Highest education level: Not on file  Occupational History  . Not on file  Tobacco Use  . Smoking status: Former Games developer  . Smokeless tobacco: Never Used  Substance and Sexual Activity  . Alcohol use: No  . Drug use: No  . Sexual activity: Not on file  Other Topics Concern  . Not on file  Social History Narrative  . Not on file   Social Determinants of Health   Financial Resource Strain:   . Difficulty of Paying Living Expenses:   Food Insecurity:   . Worried About Programme researcher, broadcasting/film/video in the Last Year:   . Barista in the Last Year:   Transportation Needs:   . Freight forwarder (Medical):   Marland Kitchen Lack of Transportation (Non-Medical):   Physical Activity:   . Days of Exercise per Week:   . Minutes of Exercise per Session:   Stress:   . Feeling of Stress :   Social Connections:   . Frequency of Communication with Friends and Family:   . Frequency of Social Gatherings with Friends and Family:   . Attends Religious Services:   . Active Member of Clubs or  Organizations:   . Attends Banker Meetings:   Marland Kitchen Marital Status:   Intimate Partner Violence:   . Fear of Current or Ex-Partner:   . Emotionally Abused:   Marland Kitchen Physically Abused:   . Sexually Abused:      PHYSICAL EXAM:  VS: Ht 5\' 8"  (1.727 m)   Wt 203 lb (92.1 kg)   LMP 05/26/2019   BMI 30.87 kg/m  Physical Exam Gen: NAD, alert, cooperative with exam, well-appearing MSK:  Neck: Limited lateral rotation to the left. Tenderness to palpation over the paraspinal cervical muscles. Tenderness palpation of the left trapezius. Normal range of motion of the left shoulder. Neurovascularly intact     ASSESSMENT & PLAN:   Myofascial pain Has been getting some improvement with her pain from the trigger point injections.  Does have abnormal sensations down the hand which are more radicular in nature. -Counseled on home exercise therapy and supportive care. -Referral to physical therapy.   Cervical radiculopathy Has abnormal sensations down the hand.  Seems more radicular as opposed to carpal tunnel. -Counseled on home exercise therapy and supportive care. -X-ray. -If no improvement will consider MRI

## 2019-06-18 NOTE — Assessment & Plan Note (Signed)
Has abnormal sensations down the hand.  Seems more radicular as opposed to carpal tunnel. -Counseled on home exercise therapy and supportive care. -X-ray. -If no improvement will consider MRI

## 2019-07-01 ENCOUNTER — Encounter: Payer: Self-pay | Admitting: Physical Therapy

## 2019-07-01 ENCOUNTER — Ambulatory Visit: Payer: Medicaid Other | Attending: Family Medicine | Admitting: Physical Therapy

## 2019-07-01 ENCOUNTER — Other Ambulatory Visit: Payer: Self-pay

## 2019-07-01 DIAGNOSIS — R29898 Other symptoms and signs involving the musculoskeletal system: Secondary | ICD-10-CM | POA: Insufficient documentation

## 2019-07-01 DIAGNOSIS — M6281 Muscle weakness (generalized): Secondary | ICD-10-CM | POA: Diagnosis not present

## 2019-07-01 DIAGNOSIS — M542 Cervicalgia: Secondary | ICD-10-CM | POA: Diagnosis not present

## 2019-07-01 DIAGNOSIS — M62838 Other muscle spasm: Secondary | ICD-10-CM | POA: Diagnosis not present

## 2019-07-01 NOTE — Therapy (Signed)
[facility] Outpatient Rehabilitation [facility] [address]  [address] [place], [place], [address] Phone: [phone]   Fax:  [phone]  Physical Therapy Evaluation  Patient Details  Name: Diane Austin MRN: [id number] Date of Birth: [birth year] Referring Provider (PT): Clare Gandy, MD   Encounter Date: 07/01/2019  PT End of Session - 07/01/19 1104    Visit Number  1    Number of Visits  4    Date for PT Re-Evaluation  07/22/19    Authorization Type  Medicaid    PT Start Time  1017    PT Stop Time  1055    PT Time Calculation (min)  38 min    Activity Tolerance  Patient tolerated treatment well;Patient limited by pain    Behavior During Therapy  [facility] for tasks assessed/performed       History reviewed. No pertinent past medical history.  Past Surgical History:  Procedure Laterality Date  . CESAREAN SECTION  2015    There were no vitals filed for this visit.   Subjective Assessment - 07/01/19 1018    Subjective  Patient reports that she pinched a nerve in her neck on May 29, 2019 while at work. 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 Initially also noticed loss of sensation in her L hand, which has recently been returning. Pain is located over L UT. Having trouble sleeping, lifting objects and lifting her son who is 28lbs, and driving. Notices a decreased appetite since her pain levels started as well as consistency of bowel movements but denies changes in B&B control. Pain is worse in the AM and somewhat better with stretching.    Pertinent History  none    Limitations  Lifting;Writing;House hold activities;Reading    Diagnostic tests  05/29/19 L shoulder xray: No fracture or dislocation.  No evident arthropathy.    Patient Stated Goals  get rid of pain    Currently in Pain?  Yes    Pain Score  4     Pain Location  Shoulder    Pain Orientation  Left    Pain  Descriptors / Indicators  Throbbing    Pain Type  Acute pain         [facility] PT Assessment - 07/01/19 1026      Assessment   Medical Diagnosis  Myofascial Pain    Referring Provider (PT)  Clare Gandy, MD    Onset Date/Surgical Date  05/29/19    Hand Dominance  Right    Next MD Visit  07/16/19    Prior Therapy  no      Precautions   Precautions  None      Balance Screen   Has the patient fallen in the past 6 months  No    Has the patient had a decrease in activity level because of a fear of falling?   No    Is the patient reluctant to leave their home because of a fear of falling?   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     Leisure  gym, playing with her kids      Cognition   Overall Cognitive Status  Within Functional Limits for tasks assessed      Sensation   Light Touch  Appears Intact   decreased sensation over L hand     Coordination   Gross Motor Movements are Fluid and Coordinated  Yes      Posture/Postural Control   Posture/Postural Control  Postural limitations    Postural Limitations  Rounded Shoulders      ROM / Strength   AROM / PROM / Strength  AROM;Strength      AROM   AROM Assessment Site  Shoulder;Cervical    Right/Left Shoulder  Right;Left    Right Shoulder Flexion  142 Degrees    Right Shoulder ABduction  148 Degrees    Right Shoulder Internal Rotation  --   FIR T9   Right Shoulder External Rotation  --   FER ER T3   Left Shoulder Flexion  65 Degrees   pain   Left Shoulder ABduction  73 Degrees   pain   Left Shoulder Internal Rotation  --   FIR proximal glute   Left Shoulder External  Rotation  --   unable to touch top of head d/t pain   Cervical Flexion  18   moderate pain   Cervical Extension  18   moderate pain   Cervical - Right Side Bend  15   moderate pain   Cervical - Left Side Bend  10   severe pain   Cervical - Right Rotation  32   mild pain   Cervical - Left Rotation  35   moderate paiun     Strength   Strength Assessment Site  Shoulder    Right/Left Shoulder  Right;Left    Right Shoulder Flexion  3+/5    Right Shoulder ABduction  3+/5    Right Shoulder Internal Rotation  4-/5    Right Shoulder External Rotation  4-/5    Left Shoulder Flexion  2+/5    Left Shoulder ABduction  2+/5    Left Shoulder Internal Rotation  3+/5    Left Shoulder External Rotation  2+/5      Palpation   Palpation comment  very diffusely TTP over L shoulder and cervical musculature; increased soft tissue restriction over B UT and B proximal biceps tendons                  Objective measurements completed on examination: See above findings.              PT Education - 07/01/19 1104    Education Details  prognosis, POC, HEP, advised to try moist heat or ice pack to L UT for 10-15 minutes for pain relief    Person(s) Educated  Patient    Methods  Explanation;Demonstration;Tactile cues;Verbal cues;Handout    Comprehension  Verbalized understanding;Returned demonstration       PT Short Term Goals - 07/01/19 1109      PT SHORT TERM GOAL #1   Title  Patient to be independent with initial HEP.    Time  1    Period  Weeks    Status  New    Target Date  07/08/19        PT Long Term Goals - 07/01/19 1110  PT LONG TERM GOAL #1   Title  Patient to be independent with advanced HEP.    Time  3    Period  Weeks    Status  New    Target Date  07/22/19      PT LONG TERM GOAL #2   Title  Patient to demonstrate cervical AROM WFL and with mild pain remaining.    Time  3    Period  Weeks    Status  New    Target Date  07/22/19      PT LONG  TERM GOAL #3   Title  Patient to demonstrate L shoulder AROM WFL and without pain limiting.    Time  3    Period  Weeks    Status  New    Target Date  07/22/19      PT LONG TERM GOAL #4   Title  Patient to demonstrate B shoulder strength 4/5.    Time  3    Period  Weeks    Status  New    Target Date  07/22/19      PT LONG TERM GOAL #5   Title  Patient to demonstrate/recall proper lifting mechanics to lift 20# box from floor without pain limiting.    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 Initially noticed a loss of sensation in her L hand, which has recently been returning. Patient now notes that she is still having trouble sleeping, lifting objects and lifting her son who is 28lbs, and driving. Denies B&B changes. Patient today presenting with rounded shoulders, limited and painful cervical and L shoulder AROM, decreased B shoulder strength, and diffuse tenderness over L shoulder and cervical musculature, as well as  increased soft tissue restriction over B UT and B proximal biceps tendons. Patient educated on gentle stretching and postural correction HEP- patient reported understanding. Would benefit from skilled PT services 1x/week for 3 weeks to address aforementioned impairments.    Personal Factors and Comorbidities  Age;Sex;Time since onset of injury/illness/exacerbation;Finances;Past/Current Experience;Profession    Examination-Activity Limitations  Bed Mobility;Sleep;Caring for Others;Carry;Dressing;Hygiene/Grooming;Lift;Self Feeding;Reach Overhead    Examination-Participation Restrictions  Cleaning;Shop;Driving;Yard Work;Laundry;Meal Prep    Stability/Clinical Decision Making  Stable/Uncomplicated    Clinical Decision Making  Low    Rehab Potential  Good     PT Frequency  1x / week    PT Duration  3 weeks    PT Treatment/Interventions  ADLs/Self Care Home Management;Cryotherapy;Electrical Stimulation;Moist Heat;Traction;Therapeutic exercise;Therapeutic activities;Functional mobility training;Ultrasound;Neuromuscular re-education;Patient/family education;Manual techniques;Passive range of motion;Dry needling;Energy conservation;Taping;Vasopneumatic Device    PT Next Visit Plan  reassess HEP; cervical and shoulder stretching, STM/desensitization to L shoulder and cervical musculature, modalities as needed    Consulted and Agree with Plan of Care  Patient       Patient will benefit from skilled therapeutic intervention in order to improve the following deficits and impairments:  Decreased activity tolerance, Decreased strength, Increased fascial restricitons, Impaired UE functional use, Pain, Increased muscle spasms, Improper body mechanics, Decreased range of motion, Postural dysfunction, Impaired flexibility  Visit Diagnosis: Cervicalgia  Muscle weakness (generalized)  Other muscle spasm  Other symptoms  and signs involving the musculoskeletal system     Problem List Patient Active Problem List   Diagnosis Date Noted  . Cervical radiculopathy 06/18/2019  . Myofascial pain 06/04/2019     Anette Guarneri, PT, DPT 07/01/19 11:13 AM   [facility] [address]  [address] [place], [place], [address] Phone: [phone]   Fax:  [phone]  Name: Diane Austin MRN: [id number] Date of Birth: [birth year]

## 2019-07-08 ENCOUNTER — Ambulatory Visit: Payer: Medicaid Other

## 2019-07-08 ENCOUNTER — Other Ambulatory Visit: Payer: Self-pay

## 2019-07-08 DIAGNOSIS — M6281 Muscle weakness (generalized): Secondary | ICD-10-CM

## 2019-07-08 DIAGNOSIS — M542 Cervicalgia: Secondary | ICD-10-CM | POA: Diagnosis not present

## 2019-07-08 DIAGNOSIS — M62838 Other muscle spasm: Secondary | ICD-10-CM | POA: Diagnosis not present

## 2019-07-08 DIAGNOSIS — R29898 Other symptoms and signs involving the musculoskeletal system: Secondary | ICD-10-CM

## 2019-07-08 NOTE — Therapy (Signed)
[facility] Outpatient Rehabilitation [facility] [address]  [address] [place], [place], [address] Phone: [phone]   Fax:  [phone]  Physical Therapy Treatment  Patient Details  Name: Diane Austin MRN: [id number] Date of Birth: 1990-04-20 Referring Provider (PT): Clare Gandy, MD   Encounter Date: 07/08/2019  PT End of Session - 07/08/19 1312    Visit Number  2    Number of Visits  4    Date for PT Re-Evaluation  07/22/19    Authorization Type  Medicaid    PT Start Time  1105    PT Stop Time  1152    PT Time Calculation (min)  47 min    Activity Tolerance  Patient tolerated treatment well;Patient limited by pain    Behavior During Therapy  [facility] for tasks assessed/performed       No past medical history on file.  Past Surgical History:  Procedure Laterality Date  . CESAREAN SECTION  2015    There were no vitals filed for this visit.  Subjective Assessment - 07/08/19 1113    Subjective  She doesn't feel the pain like she used to, but it's still sleeping that's uncomfortable and the waking up process. She is starting to feel some feeling in her palm again, but still has number in digits 2-4.5.    Diagnostic tests  05/29/19 L shoulder xray: No fracture or dislocation.  No evident arthropathy.    Patient Stated Goals  get rid of pain    Currently in Pain?  Yes    Pain Score  3     Pain Location  Neck    Pain Orientation  Left    Pain Descriptors / Indicators  Other (Comment)   stinging   Pain Type  Acute pain         [facility] PT Assessment - 07/08/19 0001      ROM / Strength   AROM / PROM / Strength  AROM      AROM   AROM Assessment Site  Cervical    Cervical Flexion  15    Cervical Extension  18    Cervical - Right Side Bend  18    Cervical - Left Side Bend  15    Cervical - Right Rotation  32   improved to 35   Cervical - Left Rotation  25   improved to 35                   [facility] Adult PT Treatment/Exercise -  07/08/19 0001      Exercises   Exercises  Neck      Neck Exercises: Theraband   Scapula Retraction  10 reps      Neck Exercises: Seated   Neck Retraction  10 reps;3 secs      Neck Exercises: Sidelying   Other Sidelying Exercise  L Thoracic Rotation x 15 with HBH      Manual Therapy   Manual Therapy  Joint mobilization;Soft tissue mobilization;Passive ROM;Manual Traction    Manual therapy comments  prone/supine, pt extremely guarded with low tolerance for anything more than gentle presure/mobs    Joint Mobilization  grade I/II L mid-cervical sideglides and cervical extension; grade II posterior/inferior GHJ glides    Soft tissue mobilization  L UT, L cervical PS    Passive ROM  Cervical rotation    Manual Traction  Very gentle cervical traction   poor traction  PT Short Term Goals - 07/01/19 1109      PT SHORT TERM GOAL #1   Title  Patient to be independent with initial HEP.    Time  1    Period  Weeks    Status  New    Target Date  07/08/19        PT Long Term Goals - 07/01/19 1110      PT LONG TERM GOAL #1   Title  Patient to be independent with advanced HEP.    Time  3    Period  Weeks    Status  New    Target Date  07/22/19      PT LONG TERM GOAL #2   Title  Patient to demonstrate cervical AROM WFL and with mild pain remaining.    Time  3    Period  Weeks    Status  New    Target Date  07/22/19      PT LONG TERM GOAL #3   Title  Patient to demonstrate L shoulder AROM WFL and without pain limiting.    Time  3    Period  Weeks    Status  New    Target Date  07/22/19      PT LONG TERM GOAL #4   Title  Patient to demonstrate B shoulder strength 4/5.    Time  3    Period  Weeks    Status  New    Target Date  07/22/19      PT LONG TERM GOAL #5   Title  Patient to demonstrate/recall proper lifting mechanics to lift 20# box from floor without pain limiting.    Time  3    Period  Weeks    Status  New    Target Date  07/22/19             Plan - 07/08/19 1313    Clinical Impression Statement  Pt presents with continued guarding to L sie of neck and shoulder, limiting tolerance to exercise and manual therapy. Heavily educated pt on "move it or lose it", moving within tolerable range throughout the day to encourage decrease in pain, as well as initiating deep breating for relaxation. Discussed heat for neck/shoulder options at home versus buying a hot pack. Reviewed HEP with pt and added L thoracic rotation to encourage motion away from neck and shoulder. Pt's rotation did improve intrasession to 35 B, but all cervical ROM continues to be limited. Pt will benefit from continued focus on restoring mobility with gentle neck/scapular strengthening.    Personal Factors and Comorbidities  Age;Sex;Time since onset of injury/illness/exacerbation;Finances;Past/Current Experience;Profession    Examination-Activity Limitations  Bed Mobility;Sleep;Caring for Others;Carry;Dressing;Hygiene/Grooming;Lift;Self Feeding;Reach Overhead    Examination-Participation Restrictions  Cleaning;Shop;Driving;Yard Work;Laundry;Meal Prep    Stability/Clinical Decision Making  Stable/Uncomplicated    Rehab Potential  Good    PT Frequency  1x / week    PT Duration  3 weeks    PT Treatment/Interventions  ADLs/Self Care Home Management;Cryotherapy;Electrical Stimulation;Moist Heat;Traction;Therapeutic exercise;Therapeutic activities;Functional mobility training;Ultrasound;Neuromuscular re-education;Patient/family education;Manual techniques;Passive range of motion;Dry needling;Energy conservation;Taping;Vasopneumatic Device    PT Next Visit Plan  cervical and shoulder stretching, gentle isometric strengthening, STM/desensitization to L shoulder and cervical musculature, modalities as needed    Consulted and Agree with Plan of Care  Patient       Patient will benefit from skilled therapeutic intervention in order to improve the following deficits and  impairments:  Decreased activity tolerance, Decreased strength,  Increased fascial restricitons, Impaired UE functional use, Pain, Increased muscle spasms, Improper body mechanics, Decreased range of motion, Postural dysfunction, Impaired flexibility  Visit Diagnosis: Cervicalgia  Muscle weakness (generalized)  Other muscle spasm  Other symptoms and signs involving the musculoskeletal system     Problem List Patient Active Problem List   Diagnosis Date Noted  . Cervical radiculopathy 06/18/2019  . Myofascial pain 06/04/2019    Izell Fobes Hill, PT, DPT 07/08/2019, 1:16 PM  [facility] [address]  [place], [place], [address] Phone: [phone]   Fax:  [phone]  Name: Diane Austin MRN: [id number] Date of Birth: 09-13-1990

## 2019-07-15 ENCOUNTER — Ambulatory Visit: Payer: Medicaid Other | Admitting: Physical Therapy

## 2019-07-15 ENCOUNTER — Other Ambulatory Visit: Payer: Self-pay

## 2019-07-15 ENCOUNTER — Encounter: Payer: Self-pay | Admitting: Physical Therapy

## 2019-07-15 DIAGNOSIS — R29898 Other symptoms and signs involving the musculoskeletal system: Secondary | ICD-10-CM | POA: Diagnosis not present

## 2019-07-15 DIAGNOSIS — M62838 Other muscle spasm: Secondary | ICD-10-CM

## 2019-07-15 DIAGNOSIS — M6281 Muscle weakness (generalized): Secondary | ICD-10-CM

## 2019-07-15 DIAGNOSIS — M542 Cervicalgia: Secondary | ICD-10-CM | POA: Diagnosis not present

## 2019-07-15 NOTE — Therapy (Signed)
[facility] [address]  [place], [place], [address] Phone: [phone]   Fax:  [phone]  Physical Therapy Treatment  Patient Details  Name: Moneisha Vosler MRN: [id number] Date of Birth: 05/11/1990 Referring Provider (PT): Clearance Coots, MD   Encounter Date: 07/15/2019   PT End of Session - 07/15/19 1147    Visit Number 3    Number of Visits 4    Date for PT Re-Evaluation 07/22/19    Authorization Type Medicaid    PT Start Time 1101    PT Stop Time 1154   moist heat   PT Time Calculation (min) 53 min    Activity Tolerance Patient tolerated treatment well;Patient limited by pain    Behavior During Therapy [facility] for tasks assessed/performed           History reviewed. No pertinent past medical history.  Past Surgical History:  Procedure Laterality Date   CESAREAN SECTION  2015    There were no vitals filed for this visit.   Subjective Assessment - 07/15/19 1104    Subjective Doing a little better. Was able to do some stretches at the gym today.    Pertinent History none    Diagnostic tests 05/29/19 L shoulder xray: No fracture or dislocation.  No evident arthropathy.    Patient Stated Goals get rid of pain    Currently in Pain? No/denies                             [facility] Adult PT Treatment/Exercise - 07/15/19 0001      Neck Exercises: Machines for Strengthening   UBE (Upper Arm Bike) L1 x 3 min forward/3 min back      Neck Exercises: Standing   Other Standing Exercises scapular row with red TB x10   cues for scapular retraction   Other Standing Exercises B shoulder extension with red TB x10   cues for scap retraction     Neck Exercises: Seated   Neck Retraction 10 reps;3 secs    Neck Retraction Limitations 2nd set with yellow TB; cues to avoid lifting chin from chest      Neck Exercises: Supine   Other Supine Exercise B horizontal abduction with yellow TB x10   cues for  improved eccentric control     Modalities   Modalities Moist Heat      Moist Heat Therapy   Number Minutes Moist Heat 10 Minutes    Moist Heat Location Shoulder   L     Manual Therapy   Manual Therapy Soft tissue mobilization    Manual therapy comments supine    Soft tissue mobilization gentle STM to L UT, LS, pec, and proximal biceps- very TTP throughout      Neck Exercises: Stretches   Upper Trapezius Stretch Right;Left;1 rep;30 seconds   with strap assist; cues to avoid pushing into pain   Other Neck Stretches L pec stretch at 60 deg 2x30"                   PT Education - 07/15/19 1147    Education Details update to HEP; edu on L shoulder desensitization techniques for improved MT tolerance    Person(s) Educated Patient    Methods Explanation;Demonstration;Verbal cues;Tactile cues;Handout    Comprehension Verbalized understanding;Returned demonstration            PT Short Term Goals - 07/15/19 1152  PT SHORT TERM GOAL #1   Title Patient to be independent with initial HEP.    Time 1    Period Weeks    Status On-going    Target Date 07/08/19             PT Long Term Goals - 07/15/19 1152      PT LONG TERM GOAL #1   Title Patient to be independent with advanced HEP.    Time 3    Period Weeks    Status On-going      PT LONG TERM GOAL #2   Title Patient to demonstrate cervical AROM WFL and with mild pain remaining.    Time 3    Period Weeks    Status On-going      PT LONG TERM GOAL #3   Title Patient to demonstrate L shoulder AROM WFL and without pain limiting.    Time 3    Period Weeks    Status On-going      PT LONG TERM GOAL #4   Title Patient to demonstrate B shoulder strength 4/5.    Time 3    Period Weeks    Status On-going      PT LONG TERM GOAL #5   Title Patient to demonstrate/recall proper lifting mechanics to lift 20# box from floor without pain limiting.    Time 3    Period Weeks    Status On-going                  Plan - 07/15/19 1148    Clinical Impression Statement Patient noting progress with therapy and was able to go to the gym this AM. Reviewed postural correction exercises with patient requiring cueing to correct positioning with cervical retractions. Performance was variable, but patient seemed to demonstrate improved carryover after review. Initiated periscapular strengthening with banded resistance with reminders to promote scapular retraction. Patient intermittently with tendency to perform lumbar lordosis rather that retraction. Patient notes relief of shoulder/neck pain after these exercises, thus updated them into HEP for continued practice. Patient reported understanding. Patient tolerated gentle STM to L shoulder musculature and still reported considerable tenderness to palpation. Educated patient on desensitization of this painful area in order to improve tolerance for MT in the future. Ended session with moist heat to L shoulder for pain relief. No complaints at end of session. Patient progressing towards goals, with guarding and pain as limiting factors thus far.    Stability/Clinical Decision Making Stable/Uncomplicated    Rehab Potential Good    PT Frequency 1x / week    PT Duration 3 weeks    PT Treatment/Interventions ADLs/Self Care Home Management;Cryotherapy;Electrical Stimulation;Moist Heat;Traction;Therapeutic exercise;Therapeutic activities;Functional mobility training;Ultrasound;Neuromuscular re-education;Patient/family education;Manual techniques;Passive range of motion;Dry needling;Energy conservation;Taping;Vasopneumatic Device    PT Next Visit Plan cervical and shoulder stretching, gentle isometric strengthening, STM/desensitization to L shoulder and cervical musculature, modalities as needed    Consulted and Agree with Plan of Care Patient           Patient will benefit from skilled therapeutic intervention in order to improve the following deficits and  impairments:  Decreased activity tolerance, Decreased strength, Increased fascial restricitons, Impaired UE functional use, Pain, Increased muscle spasms, Improper body mechanics, Decreased range of motion, Postural dysfunction, Impaired flexibility  Visit Diagnosis: Cervicalgia  Muscle weakness (generalized)  Other muscle spasm  Other symptoms and signs involving the musculoskeletal system     Problem List Patient Active Problem List   Diagnosis Date Noted  Cervical radiculopathy 06/18/2019   Myofascial pain 06/04/2019     Anette Guarneri, PT, DPT 07/15/19 11:56 AM   [facility] [address]  [address] [place], [place], [address] Phone: [phone]   Fax:  [phone]  Name: Gracious Renken MRN: [id number] Date of Birth: 11-Apr-1990

## 2019-07-16 ENCOUNTER — Encounter: Payer: Self-pay | Admitting: Family Medicine

## 2019-07-16 ENCOUNTER — Ambulatory Visit (INDEPENDENT_AMBULATORY_CARE_PROVIDER_SITE_OTHER): Payer: Medicaid Other | Admitting: Family Medicine

## 2019-07-16 VITALS — Ht 68.0 in | Wt 199.0 lb

## 2019-07-16 DIAGNOSIS — M5412 Radiculopathy, cervical region: Secondary | ICD-10-CM

## 2019-07-16 MED ORDER — GABAPENTIN 100 MG PO CAPS: 100.0000 mg | ORAL_CAPSULE | Freq: Three times a day (TID) | ORAL | 1 refills | Status: AC

## 2019-07-16 NOTE — Assessment & Plan Note (Signed)
She has limitations with her range of motion laterally as well as sensitivity over the muscles of the upper back and lower neck.  Concerned that she may have some nerve impingement in this area with ongoing pain. -Counseled on home exercise therapy and supportive care. -Can continue physical therapy. -Gabapentin. -X-ray -MRI of the cervical spine to evaluate for any nerve impingement.

## 2019-07-16 NOTE — Progress Notes (Signed)
  Marjean [place] - 29 y.o. female MRN [id number]  Date of birth: 1990-02-28  SUBJECTIVE:  Including CC & ROS.  Chief Complaint  Patient presents with  . Follow-up    neck    Weston Pernice is a 29 y.o. female that is following up for her left trapezius and neck pain.  It has improved with physical therapy.  She still has tenderness to touch over the left trapezius and neck.  She has been getting back to exercising as well.   Review of Systems See HPI   HISTORY: Past Medical, Surgical, Social, and Family History Reviewed & Updated per EMR.   Pertinent Historical Findings include:  No past medical history on file.  Past Surgical History:  Procedure Laterality Date  . CESAREAN SECTION  2015    No family history on file.  Social History   Socioeconomic History  . Marital status: Single    Spouse name: Not on file  . Number of children: Not on file  . Years of education: Not on file  . Highest education level: Not on file  Occupational History  . Not on file  Tobacco Use  . Smoking status: Former Games developer  . Smokeless tobacco: Never Used  Substance and Sexual Activity  . Alcohol use: No  . Drug use: No  . Sexual activity: Not on file  Other Topics Concern  . Not on file  Social History Narrative  . Not on file   Social Determinants of Health   Financial Resource Strain:   . Difficulty of Paying Living Expenses:   Food Insecurity:   . Worried About Programme researcher, broadcasting/film/video in the Last Year:   . Barista in the Last Year:   Transportation Needs:   . Freight forwarder (Medical):   Marland Kitchen Lack of Transportation (Non-Medical):   Physical Activity:   . Days of Exercise per Week:   . Minutes of Exercise per Session:   Stress:   . Feeling of Stress :   Social Connections:   . Frequency of Communication with Friends and Family:   . Frequency of Social Gatherings with Friends and Family:   . Attends Religious Services:   . Active Member of Clubs or Organizations:   .  Attends Banker Meetings:   Marland Kitchen Marital Status:   Intimate Partner Violence:   . Fear of Current or Ex-Partner:   . Emotionally Abused:   Marland Kitchen Physically Abused:   . Sexually Abused:      PHYSICAL EXAM:  VS: Ht 5\' 8"  (1.727 m)   Wt 199 lb (90.3 kg)   BMI 30.26 kg/m  Physical Exam Gen: NAD, alert, cooperative with exam, well-appearing MSK:  Neck/left upper back. Tenderness over the trapezius with deep palpation. Limited lateral rotation. Normal strength to resistance with left shoulder. Normal grip strength. No winging of the scapula. Neurovascularly intact     ASSESSMENT & PLAN:   Cervical radiculopathy She has limitations with her range of motion laterally as well as sensitivity over the muscles of the upper back and lower neck.  Concerned that she may have some nerve impingement in this area with ongoing pain. -Counseled on home exercise therapy and supportive care. -Can continue physical therapy. -Gabapentin. -X-ray -MRI of the cervical spine to evaluate for any nerve impingement.

## 2019-07-16 NOTE — Patient Instructions (Signed)
Good to see you Please try the gabapentin. You can start with one pill at night. It can make you sleepy. You can increase this as you tolerate to 2 or 3 pills.  I will call with the xray results.   Please send me a message in MyChart with any questions or updates.  We will setup a virtual visit once the MRI is resulted.   --Dr. Jordan Likes

## 2019-07-22 ENCOUNTER — Ambulatory Visit: Payer: Medicaid Other | Admitting: Physical Therapy

## 2019-07-24 ENCOUNTER — Encounter: Payer: Self-pay | Admitting: Physical Therapy

## 2019-07-24 ENCOUNTER — Ambulatory Visit: Payer: Medicaid Other | Admitting: Physical Therapy

## 2019-07-24 ENCOUNTER — Other Ambulatory Visit: Payer: Self-pay

## 2019-07-24 DIAGNOSIS — R29898 Other symptoms and signs involving the musculoskeletal system: Secondary | ICD-10-CM | POA: Diagnosis not present

## 2019-07-24 DIAGNOSIS — M62838 Other muscle spasm: Secondary | ICD-10-CM | POA: Diagnosis not present

## 2019-07-24 DIAGNOSIS — M6281 Muscle weakness (generalized): Secondary | ICD-10-CM

## 2019-07-24 DIAGNOSIS — M542 Cervicalgia: Secondary | ICD-10-CM | POA: Diagnosis not present

## 2019-07-24 NOTE — Therapy (Addendum)
[facility] [address]  [place], [place], [address] Phone: [phone]   Fax:  [phone]  Physical Therapy Treatment  Patient Details  Name: Diane Austin MRN: [id number] Date of Birth: 02-Jan-1991 Referring Provider (PT): Clearance Coots, MD   Encounter Date: 07/24/2019   PT End of Session - 07/24/19 1150    Visit Number 4    Number of Visits 10    Date for PT Re-Evaluation 09/11/19   offset 1 week d/t limited appointments on a holiday week   Authorization Type Medicaid    Authorization Time Period 3 visits from 06/08-06/28    Authorization - Visit Number 3    Authorization - Number of Visits 3    PT Start Time 1114   pt late   PT Stop Time 1146    PT Time Calculation (min) 32 min    Activity Tolerance Patient tolerated treatment well    Behavior During Therapy [facility] for tasks assessed/performed           History reviewed. No pertinent past medical history.  Past Surgical History:  Procedure Laterality Date  . CESAREAN SECTION  2015    There were no vitals filed for this visit.   Subjective Assessment - 07/24/19 1115    Subjective Hasn't been feeling well lately d/t asthma. Her inhaler is no longer covered by insurance. Neck is feeling a lot better- performing HEP at night.    Pertinent History none    Diagnostic tests 05/29/19 L shoulder xray: No fracture or dislocation.  No evident arthropathy.    Patient Stated Goals get rid of pain    Currently in Pain? No/denies              [facility] PT Assessment - 07/24/19 0001      Assessment   Medical Diagnosis Myofascial Pain    Referring Provider (PT) Clearance Coots, MD    Onset Date/Surgical Date 05/29/19      AROM   Left Shoulder Flexion 162 Degrees    Left Shoulder ABduction 143 Degrees    Left Shoulder Internal Rotation --   FIR T9; discomfort   Left Shoulder External Rotation --   FER T1; discomfort   Cervical Flexion 43    Cervical  Extension 50    Cervical - Right Side Bend 34    Cervical - Left Side Bend 38    Cervical - Right Rotation 62    Cervical - Left Rotation 52      Strength   Left Shoulder Flexion 4/5    Left Shoulder ABduction 4/5    Left Shoulder Internal Rotation 4/5    Left Shoulder External Rotation 4/5                         [facility] Adult PT Treatment/Exercise - 07/24/19 0001      Self-Care   Self-Care Lifting    Lifting 5x lifting 10# box from floor to chest with edu on proper positioning and body mechanics   report of 3/10 pain                 PT Education - 07/24/19 1149    Education Details discussion on objective progress and remaining goals; re-administered HEP via text for [facility] videos- Access Code [email or web address]    Person(s) Educated Patient    Methods Explanation;Demonstration;Tactile cues;Verbal cues;Handout    Comprehension Verbalized understanding;Returned demonstration  PT Short Term Goals - 07/24/19 1117      PT SHORT TERM GOAL #1   Title Patient to be independent with initial HEP.    Time 1    Period Weeks    Status Achieved    Target Date 07/08/19             PT Long Term Goals - 07/24/19 1117      PT LONG TERM GOAL #1   Title Patient to be independent with advanced HEP.    Time 7    Period Weeks    Status Partially Met   met for current   Target Date 09/11/19      PT LONG TERM GOAL #2   Title Patient to demonstrate cervical AROM WFL and with mild pain remaining.    Time 7    Period Weeks    Status Partially Met   improved in all planes, still limited in flexion and L rotation   Target Date 09/11/19      PT LONG TERM GOAL #3   Title Patient to demonstrate L shoulder AROM WFL and without pain limiting.    Time 3    Period Weeks    Status Partially Met   improved in all planes, still limited     PT LONG TERM GOAL #4   Title Patient to demonstrate B shoulder strength 4/5.    Time 3    Period Weeks    Status Achieved       PT LONG TERM GOAL #5   Title Patient to demonstrate/recall proper lifting mechanics to lift 20# box from floor without pain limiting.    Time 7    Period Weeks    Status Partially Met   3/10 pain when lifting 10# box   Target Date 09/11/19      Additional Long Term Goals   Additional Long Term Goals Yes      PT LONG TERM GOAL #6   Title Patient to report no remaining pain while playing with her children.    Time 7    Period Weeks    Status New    Target Date 09/11/19      PT LONG TERM GOAL #7   Title Patient to demonstrate >/=4+5 strength in L shoulder.    Time 7    Period Weeks    Status New    Target Date 09/11/19                 Plan - 07/24/19 1159    Clinical Impression Statement Patient reporting significant improvement in neck pain since starting therapy. Has starting exercise classes at the [facility] in order to continue her fitness regimen. Cervical AROM has improved in all planes, but still limited in flexion and L rotation. L shoulder AROM has also improved in all planes, but still limited. Strength goal was met today, but has been updated to achieve further improvement. Reviewed proper lifting mechanics using 10lb box, with cueing for proper foot positioning, use of deep squat to avoid spinal flexion, depressing the shoulders, and keeping the object close. Patient was able to perform this task with good form, but reporting 3/10 pain. Discussed remaining goals with patient, as she notes she is still unable to play with her young boys d/t her pain. Updated goals to reflect this. Patient is demonstrating excellent progress with therapy. Would benefit from additional skilled PT services 1x/week for 6 weeks to address remaining goals.    Stability/Clinical Decision  Making Stable/Uncomplicated    Rehab Potential Good    PT Frequency 1x / week    PT Duration 6 weeks    PT Treatment/Interventions ADLs/Self Care Home Management;Cryotherapy;Electrical Stimulation;Moist  Heat;Traction;Therapeutic exercise;Therapeutic activities;Functional mobility training;Ultrasound;Neuromuscular re-education;Patient/family education;Manual techniques;Passive range of motion;Dry needling;Energy conservation;Taping;Vasopneumatic Device    PT Next Visit Plan cervical and shoulder stretching, gentle isometric strengthening, STM/desensitization to L shoulder and cervical musculature, modalities as needed    Consulted and Agree with Plan of Care Patient           Patient will benefit from skilled therapeutic intervention in order to improve the following deficits and impairments:  Decreased activity tolerance, Decreased strength, Increased fascial restricitons, Impaired UE functional use, Pain, Increased muscle spasms, Improper body mechanics, Decreased range of motion, Postural dysfunction, Impaired flexibility  Visit Diagnosis: Cervicalgia  Muscle weakness (generalized)  Other muscle spasm  Other symptoms and signs involving the musculoskeletal system     Problem List Patient Active Problem List   Diagnosis Date Noted  . Cervical radiculopathy 06/18/2019  . Myofascial pain 06/04/2019      Janene Harvey, PT, DPT 07/24/19 12:03 PM    [facility] [address]  [place], [place], [address] Phone: [phone]   Fax:  [phone]  Name: Diane Austin MRN: [id number] Date of Birth: 03-28-1990   PHYSICAL THERAPY DISCHARGE SUMMARY  Visits from Start of Care: 4  Current functional level related to goals / functional outcomes: See above clinical impression- patient cx all appointments d/t feeling better   Remaining deficits: See above   Education / Equipment: HEP  Plan: Patient agrees to discharge.  Patient goals were partially met. Patient is being discharged due to being pleased with the current functional level.  ?????     Janene Harvey, PT, DPT 10/09/19 3:03 PM

## 2019-07-31 DIAGNOSIS — Z419 Encounter for procedure for purposes other than remedying health state, unspecified: Secondary | ICD-10-CM | POA: Diagnosis not present

## 2019-08-05 ENCOUNTER — Other Ambulatory Visit: Payer: Self-pay

## 2019-08-05 ENCOUNTER — Ambulatory Visit (INDEPENDENT_AMBULATORY_CARE_PROVIDER_SITE_OTHER): Payer: Medicaid Other | Admitting: Family Medicine

## 2019-08-05 ENCOUNTER — Encounter: Payer: Self-pay | Admitting: Family Medicine

## 2019-08-05 DIAGNOSIS — M5412 Radiculopathy, cervical region: Secondary | ICD-10-CM | POA: Diagnosis present

## 2019-08-05 NOTE — Patient Instructions (Signed)
Good to see you Please continue heat  Please try the exercises   Please send me a message in MyChart with any questions or updates.  Please see me back in 6 weeks.   --Dr. Jordan Likes

## 2019-08-05 NOTE — Assessment & Plan Note (Signed)
Has shown improvement with therapy and rehab thus far.  Gabapentin caused her to have some drowsiness when she takes it.  Strength and function is slowly returning. -Counseled on home exercise therapy and supportive care. -Provided work note. -Can hold off on the MRI of the cervical spine at this time. Could consider if still problematic.

## 2019-08-05 NOTE — Progress Notes (Signed)
  Diane Austin - 29 y.o. female MRN [id number]  Date of birth: [birth year]  SUBJECTIVE:  Including CC & ROS.  Chief Complaint  Patient presents with  . Follow-up    left shoulder    Diane Austin is a 29 y.o. female that is following up for her left-sided trapezius pain.  She has been going through physical therapy and doing exercises at the [facility].  She feels well during this time.  Pain has improved overall.  She is slowly getting back to her normal function.   Review of Systems See HPI   HISTORY: Past Medical, Surgical, Social, and Family History Reviewed & Updated per EMR.   Pertinent Historical Findings include:  No past medical history on file.  Past Surgical History:  Procedure Laterality Date  . CESAREAN SECTION  2015    No family history on file.  Social History   Socioeconomic History  . Marital status: Single    Spouse name: Not on file  . Number of children: Not on file  . Years of education: Not on file  . Highest education level: Not on file  Occupational History  . Not on file  Tobacco Use  . Smoking status: Former Games developer  . Smokeless tobacco: Never Used  Substance and Sexual Activity  . Alcohol use: No  . Drug use: No  . Sexual activity: Not on file  Other Topics Concern  . Not on file  Social History Narrative  . Not on file   Social Determinants of Health   Financial Resource Strain:   . Difficulty of Paying Living Expenses:   Food Insecurity:   . Worried About Programme researcher, broadcasting/film/video in the Last Year:   . Barista in the Last Year:   Transportation Needs:   . Freight forwarder (Medical):   Marland Kitchen Lack of Transportation (Non-Medical):   Physical Activity:   . Days of Exercise per Week:   . Minutes of Exercise per Session:   Stress:   . Feeling of Stress :   Social Connections:   . Frequency of Communication with Friends and Family:   . Frequency of Social Gatherings with Friends and Family:   . Attends Religious Services:   . Active  Member of Clubs or Organizations:   . Attends Banker Meetings:   Marland Kitchen Marital Status:   Intimate Partner Violence:   . Fear of Current or Ex-Partner:   . Emotionally Abused:   Marland Kitchen Physically Abused:   . Sexually Abused:      PHYSICAL EXAM:  VS: BP 107/69   Pulse 96   Ht 5\' 8"  (1.727 m)   Wt 200 lb (90.7 kg)   BMI 30.41 kg/m  Physical Exam Gen: NAD, alert, cooperative with exam, well-appearing MSK:  Neck: Normal range of motion. Normal strength resistance with shrug. Normal shoulder range of motion. Normal strength resistance. Neurovascularly intact     ASSESSMENT & PLAN:   Cervical radiculopathy Has shown improvement with therapy and rehab thus far.  Gabapentin caused her to have some drowsiness when she takes it.  Strength and function is slowly returning. -Counseled on home exercise therapy and supportive care. -Provided work note. -Can hold off on the MRI of the cervical spine at this time. Could consider if still problematic.

## 2019-08-11 ENCOUNTER — Telehealth: Payer: Self-pay | Admitting: Family Medicine

## 2019-08-11 NOTE — Telephone Encounter (Signed)
Patient called states employer denied RTW letter says it needs to state she can RTW on 08/15/19 for light duty  : no lifting over 10lbs til next OV

## 2019-08-11 NOTE — Telephone Encounter (Signed)
Pt has not scheduled the 6 week f/u as recommended--but request RTW letter specify that date.  --Please call patient if any questions @ [phone].  --glh

## 2019-08-12 ENCOUNTER — Telehealth: Payer: Self-pay | Admitting: Family Medicine

## 2019-08-12 NOTE — Telephone Encounter (Signed)
Called pt to advise updated work note ready for pick up, she states can't come till tomorrow or Thurs to pick-up.  -fyi

## 2019-08-12 NOTE — Telephone Encounter (Signed)
Adjusted work note  Diane Austin, Marye Round, MD [facility] 08/12/2019, 9:18 AM

## 2019-08-14 ENCOUNTER — Other Ambulatory Visit: Payer: Medicaid Other

## 2019-08-31 DIAGNOSIS — Z419 Encounter for procedure for purposes other than remedying health state, unspecified: Secondary | ICD-10-CM | POA: Diagnosis not present

## 2019-09-03 ENCOUNTER — Ambulatory Visit: Payer: Medicaid Other | Admitting: Physical Therapy

## 2019-09-15 ENCOUNTER — Encounter: Payer: Self-pay | Admitting: Family Medicine

## 2019-09-15 ENCOUNTER — Ambulatory Visit (INDEPENDENT_AMBULATORY_CARE_PROVIDER_SITE_OTHER): Payer: Medicaid Other | Admitting: Family Medicine

## 2019-09-15 ENCOUNTER — Other Ambulatory Visit: Payer: Self-pay

## 2019-09-15 DIAGNOSIS — M5412 Radiculopathy, cervical region: Secondary | ICD-10-CM

## 2019-09-15 NOTE — Patient Instructions (Signed)
Good to see you Please use heat as needed  Please continue the yoga   Please send me a message in MyChart with any questions or updates.  Please see [place] back as needed.   --Dr. Jordan Likes

## 2019-09-15 NOTE — Assessment & Plan Note (Signed)
Pain has improved and denies any inhibition of her movement or activity. -Counseled on home exercise therapy and supportive care. -Work note provided. -Can follow-up as needed.

## 2019-09-15 NOTE — Progress Notes (Signed)
  Diane Austin - 29 y.o. female MRN [id number]  Date of birth: Oct 08, 1990  SUBJECTIVE:  Including CC & ROS.  Chief Complaint  Patient presents with  . Follow-up    neck    Diane Austin is a 29 y.o. female that is following up for her left arm pain.  She has been using a shoulder brace which seem to help improve her symptoms.  She has been able to return back to normal activities such as working out with little to no pain.   Review of Systems See HPI   HISTORY: Past Medical, Surgical, Social, and Family History Reviewed & Updated per EMR.   Pertinent Historical Findings include:  No past medical history on file.  Past Surgical History:  Procedure Laterality Date  . CESAREAN SECTION  2015    No family history on file.  Social History   Socioeconomic History  . Marital status: Single    Spouse name: Not on file  . Number of children: Not on file  . Years of education: Not on file  . Highest education level: Not on file  Occupational History  . Not on file  Tobacco Use  . Smoking status: Former Games developer  . Smokeless tobacco: Never Used  Substance and Sexual Activity  . Alcohol use: No  . Drug use: No  . Sexual activity: Not on file  Other Topics Concern  . Not on file  Social History Narrative  . Not on file   Social Determinants of Health   Financial Resource Strain:   . Difficulty of Paying Living Expenses:   Food Insecurity:   . Worried About Programme researcher, broadcasting/film/video in the Last Year:   . Barista in the Last Year:   Transportation Needs:   . Freight forwarder (Medical):   Marland Kitchen Lack of Transportation (Non-Medical):   Physical Activity:   . Days of Exercise per Week:   . Minutes of Exercise per Session:   Stress:   . Feeling of Stress :   Social Connections:   . Frequency of Communication with Friends and Family:   . Frequency of Social Gatherings with Friends and Family:   . Attends Religious Services:   . Active Member of Clubs or Organizations:     . Attends Banker Meetings:   Marland Kitchen Marital Status:   Intimate Partner Violence:   . Fear of Current or Ex-Partner:   . Emotionally Abused:   Marland Kitchen Physically Abused:   . Sexually Abused:      PHYSICAL EXAM:  VS: BP 117/70   Pulse 76   Ht 5\' 8"  (1.727 m)   Wt 200 lb (90.7 kg)   BMI 30.41 kg/m  Physical Exam Gen: NAD, alert, cooperative with exam, well-appearing MSK:  Neck/left shoulder: No signs of atrophy. Normal range of motion. Normal strength resistance. Neurovascularly intact     ASSESSMENT & PLAN:   Cervical radiculopathy Pain has improved and denies any inhibition of her movement or activity. -Counseled on home exercise therapy and supportive care. -Work note provided. -Can follow-up as needed.

## 2019-10-08 ENCOUNTER — Encounter (HOSPITAL_BASED_OUTPATIENT_CLINIC_OR_DEPARTMENT_OTHER): Payer: Self-pay

## 2019-10-08 ENCOUNTER — Emergency Department (HOSPITAL_BASED_OUTPATIENT_CLINIC_OR_DEPARTMENT_OTHER)
Admission: EM | Admit: 2019-10-08 | Discharge: 2019-10-08 | Disposition: A | Discharge status: Home / Self Care | Payer: Medicaid Other | Attending: Emergency Medicine | Admitting: Emergency Medicine

## 2019-10-08 ENCOUNTER — Other Ambulatory Visit: Payer: Self-pay

## 2019-10-08 DIAGNOSIS — Z87891 Personal history of nicotine dependence: Secondary | ICD-10-CM | POA: Insufficient documentation

## 2019-10-08 DIAGNOSIS — R05 Cough: Secondary | ICD-10-CM | POA: Diagnosis present

## 2019-10-08 DIAGNOSIS — U071 COVID-19: Secondary | ICD-10-CM | POA: Insufficient documentation

## 2019-10-08 LAB — SARS CORONAVIRUS 2 BY RT PCR (HOSPITAL ORDER, PERFORMED IN ~~LOC~~ HOSPITAL LAB): SARS Coronavirus 2: POSITIVE — AB

## 2019-10-08 MED ORDER — ACETAMINOPHEN 500 MG PO TABS
1000.0000 mg | ORAL_TABLET | Freq: Once | ORAL | Status: AC
  Administered 2019-10-08: 1000 mg via ORAL
  Filled 2019-10-08: qty 2

## 2019-10-08 MED ORDER — ALBUTEROL SULFATE HFA 108 (90 BASE) MCG/ACT IN AERS
2.0000 | INHALATION_SPRAY | Freq: Once | RESPIRATORY_TRACT | Status: AC
  Administered 2019-10-08: 2 via RESPIRATORY_TRACT
  Filled 2019-10-08: qty 6.7

## 2019-10-08 NOTE — Discharge Instructions (Signed)
You have COVID and will need to stay home for 10 days   See your doctor for follow up   Stay hydrated   Expect to be stiff and sore and have fevers   Take tylenol, motrin for fevers   Use albuterol as needed for shortness of breath   Return to ER if you have worse shortness of breath, fever, trouble breathing

## 2019-10-08 NOTE — ED Provider Notes (Signed)
[facility] EMERGENCY DEPARTMENT Provider Note   CSN: [id number] Arrival date & time: 10/08/19  1731     History Chief Complaint  Patient presents with  . Cough    Diane Austin is a 29 y.o. female here presenting after a possible Covid exposure.  Patient states that recently she has been staying with other family members. Patient noticed that other family members started coughing about a week ago.  She started having some headaches and cough since yesterday.  She was concerned that she may have caught Covid from her family members.  She did not receive the Covid vaccine.   The history is provided by the patient.       History reviewed. No pertinent past medical history.  Patient Active Problem List   Diagnosis Date Noted  . Cervical radiculopathy 06/18/2019  . Myofascial pain 06/04/2019    Past Surgical History:  Procedure Laterality Date  . CESAREAN SECTION  2015     OB History    Gravida  1   Para      Term      Preterm      AB      Living        SAB      TAB      Ectopic      Multiple      Live Births              No family history on file.  Social History   Tobacco Use  . Smoking status: Former Games developer  . Smokeless tobacco: Never Used  Vaping Use  . Vaping Use: Never used  Substance Use Topics  . Alcohol use: No  . Drug use: No    Home Medications Prior to Admission medications   Medication Sig Start Date End Date Taking? Authorizing Provider  gabapentin (NEURONTIN) 100 MG capsule Take 1 capsule (100 mg total) by mouth 3 (three) times daily. 07/16/19   Myra Rude, MD  ibuprofen (ADVIL) 600 MG tablet Take 1 tablet (600 mg total) by mouth every 8 (eight) hours as needed. 06/04/19   Myra Rude, MD  methocarbamol (ROBAXIN) 500 MG tablet Take 1 tablet (500 mg total) by mouth 2 (two) times daily. 05/29/19   Dartha Lodge, PA-C  methylPREDNISolone (MEDROL DOSEPAK) 4 MG TBPK tablet Take as directed 05/29/19   Dartha Lodge, PA-C  naproxen (NAPROSYN) 500 MG tablet Take 1 tablet (500 mg total) by mouth 2 (two) times daily as needed. 05/29/19   Dartha Lodge, PA-C  Prenatal Vit-Fe Fumarate-FA (PRENATAL MULTIVITAMIN) TABS tablet Take 1 tablet by mouth daily at 12 noon. 04/06/17   Maczis, Elmer Sow, PA-C    Allergies    Patient has no known allergies.  Review of Systems   Review of Systems  Respiratory: Positive for cough.   All other systems reviewed and are negative.   Physical Exam Updated Vital Signs BP 124/90 (BP Location: Left Arm)   Pulse 88   Temp 98.9 F (37.2 C) (Oral)   Resp 18   Ht 5\' 8"  (1.727 m)   Wt 90.7 kg   LMP 09/14/2019   SpO2 98%   BMI 30.41 kg/m   Physical Exam Vitals and nursing note reviewed.  HENT:     Head: Normocephalic.     Right Ear: Tympanic membrane normal.     Nose: Nose normal.  Eyes:     Extraocular Movements: Extraocular movements intact.  Pupils: Pupils are equal, round, and reactive to light.  Cardiovascular:     Rate and Rhythm: Normal rate and regular rhythm.     Pulses: Normal pulses.     Heart sounds: Normal heart sounds.  Pulmonary:     Comments: Diminished bilaterally, no wheezing or crackles  Abdominal:     General: Abdomen is flat.     Palpations: Abdomen is soft.  Musculoskeletal:        General: Normal range of motion.     Cervical back: Normal range of motion.  Skin:    General: Skin is warm.     Capillary Refill: Capillary refill takes less than 2 seconds.  Neurological:     General: No focal deficit present.     Mental Status: She is alert and oriented to person, place, and time.  Psychiatric:        Mood and Affect: Mood normal.     ED Results / Procedures / Treatments   Labs (all labs ordered are listed, but only abnormal results are displayed) Labs Reviewed  SARS CORONAVIRUS 2 BY RT PCR (HOSPITAL ORDER, PERFORMED IN [facility]) - Abnormal; Notable for the following components:      Result Value    SARS Coronavirus 2 POSITIVE (*)    All other components within normal limits    EKG None  Radiology No results found.  Procedures Procedures (including critical care time)  Medications Ordered in ED Medications  acetaminophen (TYLENOL) tablet 1,000 mg (has no administration in time range)  albuterol (VENTOLIN HFA) 108 (90 Base) MCG/ACT inhaler 2 puff (has no administration in time range)    ED Course  I have reviewed the triage vital signs and the nursing notes.  Pertinent labs & imaging results that were available during my care of the patient were reviewed by me and considered in my medical decision making (see chart for details).    MDM Rules/Calculators/A&P                          Diane Austin is a 29 y.o. female here with cough.  Patient did not receive the Covid vaccine.  Multiple family members sick with similar symptoms.  Patient is Covid positive.  She is not hypoxic.  Her lungs are clear.  I told her to stay home for 10 days.  Recommend Tylenol or Motrin for fever and albuterol as needed for shortness of breath.  Final Clinical Impression(s) / ED Diagnoses Final diagnoses:  None    Rx / DC Orders ED Discharge Orders    None       Charlynne Pander, MD 10/08/19 2025

## 2019-10-08 NOTE — ED Triage Notes (Signed)
Pt c/o flu like sx day with +covid exposure-NAD-steady gait

## 2019-10-08 NOTE — ED Notes (Signed)
ED Provider at bedside. 

## 2019-11-01 DIAGNOSIS — Z20822 Contact with and (suspected) exposure to covid-19: Secondary | ICD-10-CM | POA: Diagnosis not present

## 2019-11-01 DIAGNOSIS — Z5901 Sheltered homelessness: Secondary | ICD-10-CM | POA: Diagnosis not present

## 2019-11-01 DIAGNOSIS — F1210 Cannabis abuse, uncomplicated: Secondary | ICD-10-CM | POA: Diagnosis not present

## 2019-11-01 DIAGNOSIS — Z6830 Body mass index (BMI) 30.0-30.9, adult: Secondary | ICD-10-CM | POA: Diagnosis not present

## 2019-11-01 DIAGNOSIS — R451 Restlessness and agitation: Secondary | ICD-10-CM | POA: Diagnosis not present

## 2019-11-01 DIAGNOSIS — F29 Unspecified psychosis not due to a substance or known physiological condition: Secondary | ICD-10-CM | POA: Diagnosis not present

## 2019-11-01 DIAGNOSIS — G4700 Insomnia, unspecified: Secondary | ICD-10-CM | POA: Diagnosis not present

## 2019-11-01 DIAGNOSIS — Z720 Tobacco use: Secondary | ICD-10-CM | POA: Diagnosis not present

## 2019-11-01 DIAGNOSIS — E669 Obesity, unspecified: Secondary | ICD-10-CM | POA: Diagnosis not present

## 2019-11-01 DIAGNOSIS — R443 Hallucinations, unspecified: Secondary | ICD-10-CM | POA: Diagnosis not present

## 2019-11-03 DIAGNOSIS — F29 Unspecified psychosis not due to a substance or known physiological condition: Secondary | ICD-10-CM | POA: Diagnosis not present

## 2019-11-04 DIAGNOSIS — F29 Unspecified psychosis not due to a substance or known physiological condition: Secondary | ICD-10-CM | POA: Diagnosis not present

## 2019-11-05 DIAGNOSIS — F29 Unspecified psychosis not due to a substance or known physiological condition: Secondary | ICD-10-CM | POA: Diagnosis not present

## 2019-11-07 DIAGNOSIS — F29 Unspecified psychosis not due to a substance or known physiological condition: Secondary | ICD-10-CM | POA: Diagnosis not present

## 2020-06-14 ENCOUNTER — Telehealth: Payer: Self-pay

## 2020-06-14 NOTE — Telephone Encounter (Signed)
..   Medicaid Managed Care   Unsuccessful Outreach Note  06/14/2020 Name: Diane Austin MRN: [id number] DOB: 1990-07-25  Referred by: Patient, No Pcp Per (Inactive) Reason for referral : High Risk Managed Medicaid (Attempted to reach patient to get her scheduled with the [facility] LCSW and the Pharmacist. )   An unsuccessful telephone outreach was attempted today. The patient was referred to the case management team for assistance with care management and care coordination.   Follow Up Plan: The care management team will reach out to the patient again over the next 7-14 days.   Weston Settle Care Guide, High Risk Medicaid Managed Care Embedded Care Coordination [facility]  [facility]

## 2020-07-07 ENCOUNTER — Telehealth: Payer: Self-pay

## 2020-07-07 NOTE — Telephone Encounter (Signed)
..   Medicaid Managed Care   Unsuccessful Outreach Note  07/07/2020 Name: Diane Austin MRN: [id number] DOB: [birth year]  Referred by: Patient, No Pcp Per (Inactive) Reason for referral : High Risk Managed Medicaid (Attempted to reach Ms.Becknell again today to get her scheduled with the [facility] team. She did not answer and her VM was full.)   A second unsuccessful telephone outreach was attempted today. The patient was referred to the case management team for assistance with care management and care coordination.   Follow Up Plan: The care management team will reach out to the patient again over the next 7-14 days.   Weston Settle Care Guide, High Risk Medicaid Managed Care Embedded Care Coordination [facility]  [facility]

## 2020-10-18 ENCOUNTER — Telehealth: Payer: Self-pay

## 2020-10-18 NOTE — Telephone Encounter (Signed)
..   Medicaid Managed Care   Unsuccessful Outreach Note  10/18/2020 Name: Diane Austin MRN: [id number] DOB: [birth year]  Referred by: Patient, No Pcp Per (Inactive) Reason for referral : High Risk Managed Medicaid (I called the patient today to get her scheduled with the [facility] Team. She did  not answer and I was not able to leave a message. This was the third attempt.)   Third unsuccessful telephone outreach was attempted today. The patient was referred to the case management team for assistance with care management and care coordination. The patient's primary care provider has been notified of our unsuccessful attempts to make or maintain contact with the patient. The care management team is pleased to engage with this patient at any time in the future should he/she be interested in assistance from the care management team.   Follow Up Plan: We have been unable to make contact with the patient for follow up. The care management team is available to follow up with the patient after provider conversation with the patient regarding recommendation for care management engagement and subsequent re-referral to the care management team.   Weston Settle Care Guide, High Risk Medicaid Managed Care Embedded Care Coordination [facility]  [facility]

## 2021-06-17 IMAGING — CR DG SHOULDER 2+V*L*
2 series · 2 of 2 positions shown · non-contrast
Comparison: None.

CLINICAL DATA: Pain after heavy lifting

EXAM:
LEFT SHOULDER - 2+ VIEW

[w shoulder grashey left]
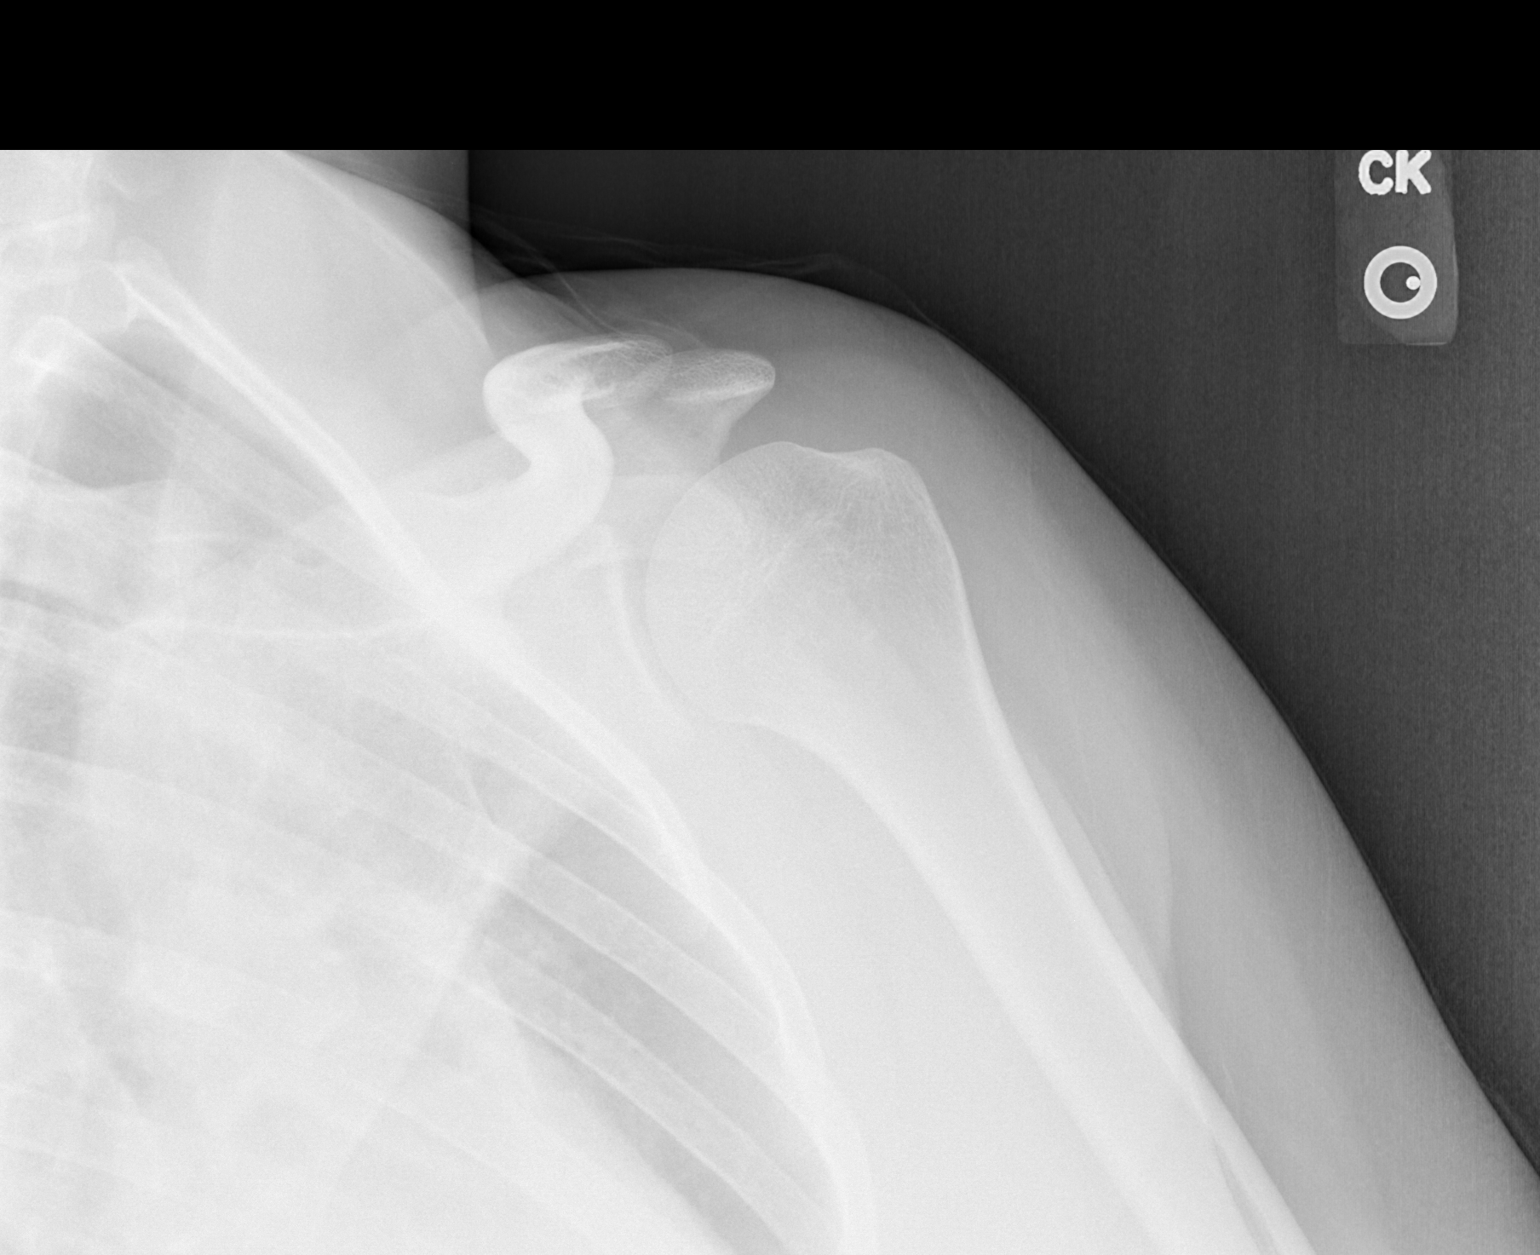

[w shoulder y view left]
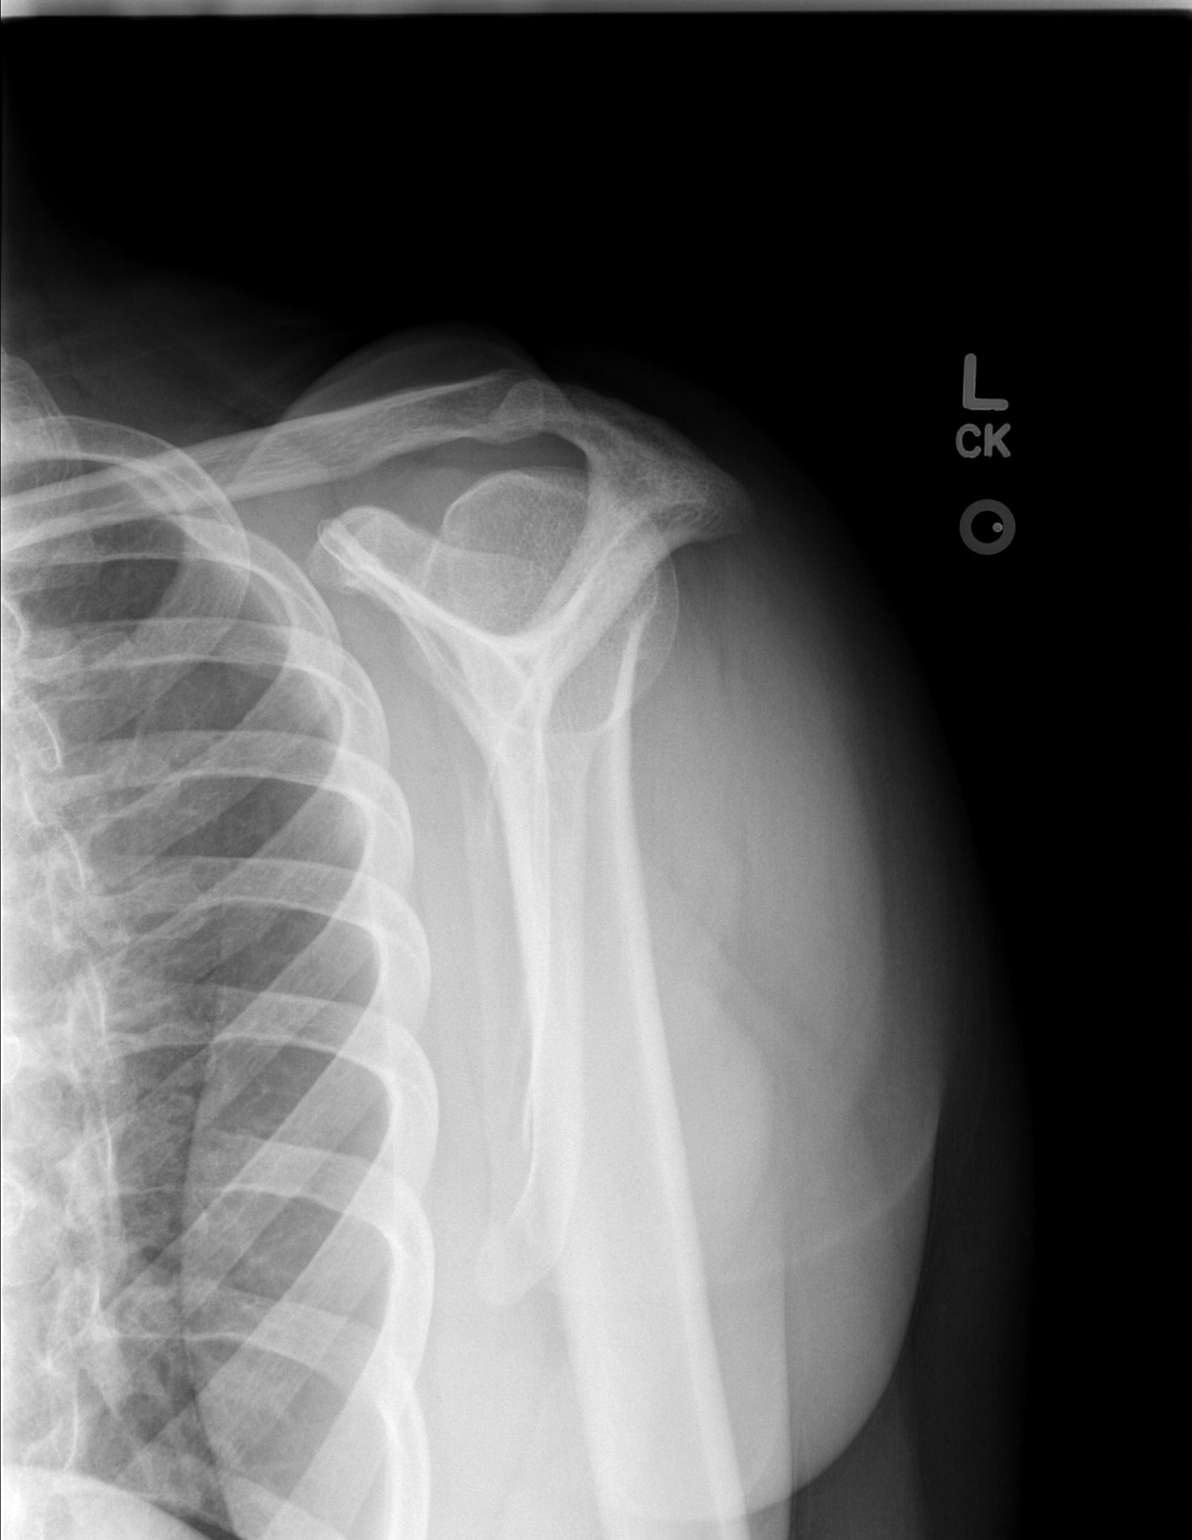

[2 of 2 positions shown; findings below may reference images not displayed]

FINDINGS: Oblique and Y scapular images were obtained. No fracture or
dislocation. Joint spaces appear normal. No erosive change or
intra-articular calcification. Visualized left
IMPRESSION: No fracture or dislocation.  No evident arthropathy.

## 2022-02-22 ENCOUNTER — Encounter (HOSPITAL_BASED_OUTPATIENT_CLINIC_OR_DEPARTMENT_OTHER): Payer: Self-pay

## 2022-02-22 ENCOUNTER — Emergency Department (HOSPITAL_BASED_OUTPATIENT_CLINIC_OR_DEPARTMENT_OTHER)
Admission: EM | Admit: 2022-02-22 | Discharge: 2022-02-22 | Disposition: A | Discharge status: Home / Self Care | Payer: Medicaid Other | Attending: Emergency Medicine | Admitting: Emergency Medicine

## 2022-02-22 ENCOUNTER — Other Ambulatory Visit: Payer: Self-pay

## 2022-02-22 DIAGNOSIS — H9201 Otalgia, right ear: Secondary | ICD-10-CM | POA: Insufficient documentation

## 2022-02-22 DIAGNOSIS — Z1152 Encounter for screening for COVID-19: Secondary | ICD-10-CM | POA: Diagnosis not present

## 2022-02-22 LAB — RESP PANEL BY RT-PCR (RSV, FLU A&B, COVID)  RVPGX2
Influenza A by PCR: NEGATIVE
Influenza B by PCR: NEGATIVE
Resp Syncytial Virus by PCR: NEGATIVE
SARS Coronavirus 2 by RT PCR: NEGATIVE

## 2022-02-22 LAB — GROUP A STREP BY PCR: Group A Strep by PCR: NOT DETECTED

## 2022-02-22 MED ORDER — IBUPROFEN 400 MG PO TABS
400.0000 mg | ORAL_TABLET | Freq: Once | ORAL | Status: AC
  Administered 2022-02-22: 400 mg via ORAL
  Filled 2022-02-22: qty 1

## 2022-02-22 NOTE — ED Provider Notes (Signed)
[place] [facility] [place] Provider Note   CSN: [id number] Arrival date & time: 02/22/22  0245     History  Chief Complaint  Patient presents with   Otalgia    Diane Austin is a 32 y.o. female.  The history is provided by the patient.  Otalgia Diane Austin is a 32 y.o. female who presents to the Emergency Department complaining of the emergency department for earache.  She evaluation of 3 weeks of right-sided ear discomfort.  She states that symptoms worsened today, which prompted an evaluation.  She does feel a little bit swollen around her ear.  She also reports mild intermittent sore throat.  No fever, cough, difficulty breathing, nausea, vomiting.  She has no known medical problems and takes no routine medications.  Denies any chance of pregnancy.     Home Medications Prior to Admission medications   Medication Sig Start Date End Date Taking? Authorizing Provider  gabapentin (NEURONTIN) 100 MG capsule Take 1 capsule (100 mg total) by mouth 3 (three) times daily. 07/16/19   Rosemarie Ax, MD  ibuprofen (ADVIL) 600 MG tablet Take 1 tablet (600 mg total) by mouth every 8 (eight) hours as needed. 06/04/19   Rosemarie Ax, MD  methocarbamol (ROBAXIN) 500 MG tablet Take 1 tablet (500 mg total) by mouth 2 (two) times daily. 05/29/19   Jacqlyn Larsen, PA-C  methylPREDNISolone (MEDROL DOSEPAK) 4 MG TBPK tablet Take as directed 05/29/19   Jacqlyn Larsen, PA-C  naproxen (NAPROSYN) 500 MG tablet Take 1 tablet (500 mg total) by mouth 2 (two) times daily as needed. 05/29/19   Jacqlyn Larsen, PA-C  Prenatal Vit-Fe Fumarate-FA (PRENATAL MULTIVITAMIN) TABS tablet Take 1 tablet by mouth daily at 12 noon. 04/06/17   Maczis, Barth Kirks, PA-C      Allergies    Patient has no known allergies.    Review of Systems   Review of Systems  HENT:  Positive for ear pain.   All other systems reviewed and are negative.   Physical Exam Updated Vital Signs BP 110/75 (BP  Location: Right Arm)   Pulse 86   Temp 98.6 F (37 C) (Oral)   Resp 16   Ht 5\' 7"  (1.702 m)   Wt 80.7 kg   LMP 02/08/2022 (Approximate)   SpO2 100%   BMI 27.88 kg/m  Physical Exam Vitals and nursing note reviewed.  Constitutional:      Appearance: She is well-developed.  HENT:     Head: Normocephalic and atraumatic.     Comments: No significant erythema in the canals bilaterally.  No significant effusions bilaterally.  No mastoid tenderness or swelling.  Mild erythema in the posterior oropharynx without any edema or exudates.  No significant cervical lymphadenopathy. Cardiovascular:     Rate and Rhythm: Normal rate and regular rhythm.  Pulmonary:     Effort: Pulmonary effort is normal. No respiratory distress.  Musculoskeletal:        General: No tenderness.     Cervical back: Neck supple.  Skin:    General: Skin is warm and dry.  Neurological:     Mental Status: She is alert and oriented to person, place, and time.  Psychiatric:        Behavior: Behavior normal.     ED Results / Procedures / Treatments   Labs (all labs ordered are listed, but only abnormal results are displayed) Labs Reviewed  GROUP A STREP BY PCR  RESP PANEL BY RT-PCR (  RSV, FLU A&B, COVID)  [id number]    EKG None  Radiology No results found.  Procedures Procedures    Medications Ordered in ED Medications  ibuprofen (ADVIL) tablet 400 mg (400 mg Oral Given 02/22/22 2951)    ED Course/ Medical Decision Making/ A&P                             Medical Decision Making Risk Prescription drug management.   Patient here for evaluation of 3 weeks of right-sided ear pain.  No evidence of otitis media, otitis externa, strep pharyngitis, PTA, RPA, epiglottitis, mastoiditis.  Discussed with patient unclear source of symptoms.  Recommend OTC analgesics such as ibuprofen, acetaminophen or tea with honey.  Discussed outpatient follow-up and return precautions.        Final Clinical Impression(s)  / ED Diagnoses Final diagnoses:  Right ear pain    Rx / DC Orders ED Discharge Orders     None         Quintella Reichert, MD 02/22/22 0400

## 2022-02-22 NOTE — ED Triage Notes (Addendum)
Pt reports right ear ache x 1-2 weeks. Denies hearing changes. She pain in her throat when she swallows.

## 2022-05-18 ENCOUNTER — Encounter: Payer: Self-pay | Admitting: *Deleted
